# Patient Record
Sex: Female | Born: 1975 | Race: White | Hispanic: No | Marital: Single | Smoking: Current every day smoker
Health system: Southern US, Community
[De-identification: ages and names within clinical notes are randomized; demographics above are authoritative.]

## PROBLEM LIST (undated history)

## (undated) DIAGNOSIS — M5430 Sciatica, unspecified side: Secondary | ICD-10-CM

## (undated) DIAGNOSIS — G8929 Other chronic pain: Secondary | ICD-10-CM

## (undated) DIAGNOSIS — R519 Headache, unspecified: Secondary | ICD-10-CM

## (undated) DIAGNOSIS — M719 Bursopathy, unspecified: Secondary | ICD-10-CM

## (undated) DIAGNOSIS — R51 Headache: Secondary | ICD-10-CM

## (undated) HISTORY — PX: CHOLECYSTECTOMY: SHX55

## (undated) HISTORY — PX: OTHER SURGICAL HISTORY: SHX169

## (undated) HISTORY — DX: Headache: R51

## (undated) HISTORY — DX: Bursopathy, unspecified: M71.9

## (undated) HISTORY — PX: BACK SURGERY: SHX140

## (undated) HISTORY — DX: Other chronic pain: G89.29

## (undated) HISTORY — DX: Headache, unspecified: R51.9

---

## 1998-07-29 ENCOUNTER — Inpatient Hospital Stay (HOSPITAL_COMMUNITY): Admission: AD | Admit: 1998-07-29 | Discharge: 1998-07-29 | Payer: Self-pay | Admitting: Obstetrics & Gynecology

## 1998-10-02 ENCOUNTER — Emergency Department (HOSPITAL_COMMUNITY): Admission: EM | Admit: 1998-10-02 | Discharge: 1998-10-02 | Payer: Self-pay | Admitting: Emergency Medicine

## 1999-01-26 ENCOUNTER — Emergency Department (HOSPITAL_COMMUNITY): Admission: EM | Admit: 1999-01-26 | Discharge: 1999-01-26 | Payer: Self-pay | Admitting: Emergency Medicine

## 2000-04-04 ENCOUNTER — Ambulatory Visit (HOSPITAL_COMMUNITY): Admission: RE | Admit: 2000-04-04 | Discharge: 2000-04-04 | Payer: Self-pay | Admitting: Obstetrics and Gynecology

## 2000-04-04 ENCOUNTER — Encounter: Payer: Self-pay | Admitting: Obstetrics and Gynecology

## 2000-06-29 ENCOUNTER — Inpatient Hospital Stay (HOSPITAL_COMMUNITY): Admission: AD | Admit: 2000-06-29 | Discharge: 2000-06-29 | Payer: Self-pay | Admitting: Obstetrics and Gynecology

## 2000-07-13 ENCOUNTER — Inpatient Hospital Stay (HOSPITAL_COMMUNITY): Admission: AD | Admit: 2000-07-13 | Discharge: 2000-07-13 | Payer: Self-pay | Admitting: Obstetrics and Gynecology

## 2000-08-04 ENCOUNTER — Inpatient Hospital Stay (HOSPITAL_COMMUNITY): Admission: AD | Admit: 2000-08-04 | Discharge: 2000-08-07 | Payer: Self-pay | Admitting: Obstetrics and Gynecology

## 2000-08-05 ENCOUNTER — Encounter: Payer: Self-pay | Admitting: Obstetrics and Gynecology

## 2000-09-11 ENCOUNTER — Other Ambulatory Visit: Admission: RE | Admit: 2000-09-11 | Discharge: 2000-09-11 | Payer: Self-pay | Admitting: Obstetrics and Gynecology

## 2000-12-12 ENCOUNTER — Other Ambulatory Visit: Admission: RE | Admit: 2000-12-12 | Discharge: 2000-12-12 | Payer: Self-pay | Admitting: Obstetrics and Gynecology

## 2001-04-03 ENCOUNTER — Emergency Department (HOSPITAL_COMMUNITY): Admission: EM | Admit: 2001-04-03 | Discharge: 2001-04-03 | Payer: Self-pay | Admitting: Emergency Medicine

## 2001-04-09 ENCOUNTER — Other Ambulatory Visit: Admission: RE | Admit: 2001-04-09 | Discharge: 2001-04-09 | Payer: Self-pay | Admitting: Obstetrics and Gynecology

## 2001-07-10 ENCOUNTER — Other Ambulatory Visit: Admission: RE | Admit: 2001-07-10 | Discharge: 2001-07-10 | Payer: Self-pay | Admitting: Obstetrics and Gynecology

## 2001-09-05 ENCOUNTER — Emergency Department (HOSPITAL_COMMUNITY): Admission: EM | Admit: 2001-09-05 | Discharge: 2001-09-06 | Payer: Self-pay | Admitting: Emergency Medicine

## 2001-09-23 ENCOUNTER — Other Ambulatory Visit: Admission: RE | Admit: 2001-09-23 | Discharge: 2001-09-23 | Payer: Self-pay | Admitting: Obstetrics and Gynecology

## 2001-12-31 ENCOUNTER — Other Ambulatory Visit: Admission: RE | Admit: 2001-12-31 | Discharge: 2001-12-31 | Payer: Self-pay | Admitting: Obstetrics and Gynecology

## 2002-02-04 ENCOUNTER — Observation Stay (HOSPITAL_COMMUNITY): Admission: EM | Admit: 2002-02-04 | Discharge: 2002-02-04 | Payer: Self-pay | Admitting: Emergency Medicine

## 2002-03-13 ENCOUNTER — Emergency Department (HOSPITAL_COMMUNITY): Admission: EM | Admit: 2002-03-13 | Discharge: 2002-03-13 | Payer: Self-pay | Admitting: Emergency Medicine

## 2002-07-29 ENCOUNTER — Other Ambulatory Visit: Admission: RE | Admit: 2002-07-29 | Discharge: 2002-07-29 | Payer: Self-pay | Admitting: Obstetrics and Gynecology

## 2002-09-20 ENCOUNTER — Emergency Department (HOSPITAL_COMMUNITY): Admission: EM | Admit: 2002-09-20 | Discharge: 2002-09-21 | Payer: Self-pay | Admitting: Emergency Medicine

## 2003-06-23 ENCOUNTER — Inpatient Hospital Stay (HOSPITAL_COMMUNITY): Admission: AD | Admit: 2003-06-23 | Discharge: 2003-06-26 | Payer: Self-pay | Admitting: Obstetrics and Gynecology

## 2003-06-23 ENCOUNTER — Encounter (INDEPENDENT_AMBULATORY_CARE_PROVIDER_SITE_OTHER): Payer: Self-pay | Admitting: Specialist

## 2003-08-02 ENCOUNTER — Emergency Department (HOSPITAL_COMMUNITY): Admission: EM | Admit: 2003-08-02 | Discharge: 2003-08-02 | Payer: Self-pay | Admitting: Emergency Medicine

## 2003-08-11 ENCOUNTER — Other Ambulatory Visit: Admission: RE | Admit: 2003-08-11 | Discharge: 2003-08-11 | Payer: Self-pay | Admitting: Obstetrics and Gynecology

## 2003-09-17 ENCOUNTER — Emergency Department (HOSPITAL_COMMUNITY): Admission: EM | Admit: 2003-09-17 | Discharge: 2003-09-17 | Payer: Self-pay | Admitting: Emergency Medicine

## 2004-07-27 ENCOUNTER — Encounter: Admission: RE | Admit: 2004-07-27 | Discharge: 2004-07-27 | Payer: Self-pay | Admitting: Orthopedic Surgery

## 2004-08-10 ENCOUNTER — Encounter: Admission: RE | Admit: 2004-08-10 | Discharge: 2004-08-10 | Payer: Self-pay | Admitting: Orthopedic Surgery

## 2004-09-07 ENCOUNTER — Other Ambulatory Visit: Admission: RE | Admit: 2004-09-07 | Discharge: 2004-09-07 | Payer: Self-pay | Admitting: Obstetrics and Gynecology

## 2005-04-15 ENCOUNTER — Emergency Department (HOSPITAL_COMMUNITY): Admission: EM | Admit: 2005-04-15 | Discharge: 2005-04-15 | Payer: Self-pay | Admitting: Emergency Medicine

## 2006-02-11 ENCOUNTER — Emergency Department (HOSPITAL_COMMUNITY): Admission: EM | Admit: 2006-02-11 | Discharge: 2006-02-11 | Payer: Self-pay | Admitting: Emergency Medicine

## 2006-08-22 ENCOUNTER — Emergency Department (HOSPITAL_COMMUNITY): Admission: EM | Admit: 2006-08-22 | Discharge: 2006-08-22 | Payer: Self-pay | Admitting: Emergency Medicine

## 2006-08-22 ENCOUNTER — Inpatient Hospital Stay (HOSPITAL_COMMUNITY): Admission: EM | Admit: 2006-08-22 | Discharge: 2006-08-24 | Payer: Self-pay | Admitting: Emergency Medicine

## 2006-08-23 ENCOUNTER — Encounter (INDEPENDENT_AMBULATORY_CARE_PROVIDER_SITE_OTHER): Payer: Self-pay | Admitting: Specialist

## 2007-01-26 ENCOUNTER — Emergency Department (HOSPITAL_COMMUNITY): Admission: EM | Admit: 2007-01-26 | Discharge: 2007-01-26 | Payer: Self-pay | Admitting: Emergency Medicine

## 2007-02-18 ENCOUNTER — Emergency Department (HOSPITAL_COMMUNITY): Admission: EM | Admit: 2007-02-18 | Discharge: 2007-02-18 | Payer: Self-pay | Admitting: Emergency Medicine

## 2007-05-16 ENCOUNTER — Emergency Department (HOSPITAL_COMMUNITY): Admission: EM | Admit: 2007-05-16 | Discharge: 2007-05-16 | Payer: Self-pay | Admitting: Emergency Medicine

## 2007-08-26 ENCOUNTER — Ambulatory Visit (HOSPITAL_COMMUNITY): Admission: RE | Admit: 2007-08-26 | Discharge: 2007-08-26 | Payer: Self-pay | Admitting: Obstetrics and Gynecology

## 2007-10-17 ENCOUNTER — Encounter: Admission: RE | Admit: 2007-10-17 | Discharge: 2007-10-17 | Payer: Self-pay | Admitting: Orthopedic Surgery

## 2008-05-09 ENCOUNTER — Emergency Department (HOSPITAL_COMMUNITY): Admission: EM | Admit: 2008-05-09 | Discharge: 2008-05-09 | Payer: Self-pay | Admitting: Emergency Medicine

## 2008-07-01 ENCOUNTER — Emergency Department (HOSPITAL_COMMUNITY): Admission: EM | Admit: 2008-07-01 | Discharge: 2008-07-01 | Payer: Self-pay | Admitting: Emergency Medicine

## 2008-08-15 ENCOUNTER — Emergency Department (HOSPITAL_COMMUNITY): Admission: EM | Admit: 2008-08-15 | Discharge: 2008-08-16 | Payer: Self-pay | Admitting: Emergency Medicine

## 2008-10-12 ENCOUNTER — Emergency Department (HOSPITAL_COMMUNITY): Admission: EM | Admit: 2008-10-12 | Discharge: 2008-10-13 | Payer: Self-pay | Admitting: *Deleted

## 2009-02-06 ENCOUNTER — Emergency Department (HOSPITAL_COMMUNITY): Admission: EM | Admit: 2009-02-06 | Discharge: 2009-02-06 | Payer: Self-pay | Admitting: Emergency Medicine

## 2009-05-29 ENCOUNTER — Emergency Department (HOSPITAL_COMMUNITY): Admission: EM | Admit: 2009-05-29 | Discharge: 2009-05-29 | Payer: Self-pay | Admitting: Emergency Medicine

## 2009-07-31 ENCOUNTER — Emergency Department (HOSPITAL_COMMUNITY): Admission: EM | Admit: 2009-07-31 | Discharge: 2009-07-31 | Payer: Self-pay | Admitting: Emergency Medicine

## 2009-08-28 ENCOUNTER — Encounter (INDEPENDENT_AMBULATORY_CARE_PROVIDER_SITE_OTHER): Payer: Self-pay | Admitting: *Deleted

## 2009-08-28 ENCOUNTER — Emergency Department (HOSPITAL_COMMUNITY): Admission: EM | Admit: 2009-08-28 | Discharge: 2009-08-28 | Payer: Self-pay | Admitting: Emergency Medicine

## 2009-09-30 ENCOUNTER — Emergency Department (HOSPITAL_COMMUNITY): Admission: EM | Admit: 2009-09-30 | Discharge: 2009-09-30 | Payer: Self-pay | Admitting: Emergency Medicine

## 2009-10-25 ENCOUNTER — Emergency Department (HOSPITAL_COMMUNITY): Admission: EM | Admit: 2009-10-25 | Discharge: 2009-10-25 | Payer: Self-pay | Admitting: Emergency Medicine

## 2009-10-27 ENCOUNTER — Encounter (INDEPENDENT_AMBULATORY_CARE_PROVIDER_SITE_OTHER): Payer: Self-pay | Admitting: *Deleted

## 2009-11-10 ENCOUNTER — Encounter: Admission: RE | Admit: 2009-11-10 | Discharge: 2009-11-10 | Payer: Self-pay | Admitting: Orthopaedic Surgery

## 2009-12-15 ENCOUNTER — Emergency Department (HOSPITAL_COMMUNITY): Admission: EM | Admit: 2009-12-15 | Discharge: 2009-12-15 | Payer: Self-pay | Admitting: Emergency Medicine

## 2009-12-20 ENCOUNTER — Emergency Department (HOSPITAL_COMMUNITY): Admission: EM | Admit: 2009-12-20 | Discharge: 2009-12-20 | Payer: Self-pay | Admitting: Emergency Medicine

## 2010-01-18 ENCOUNTER — Emergency Department (HOSPITAL_COMMUNITY): Admission: EM | Admit: 2010-01-18 | Discharge: 2010-01-18 | Payer: Self-pay | Admitting: Emergency Medicine

## 2010-01-28 ENCOUNTER — Ambulatory Visit (HOSPITAL_COMMUNITY): Admission: RE | Admit: 2010-01-28 | Discharge: 2010-01-30 | Payer: Self-pay | Admitting: Orthopaedic Surgery

## 2010-06-16 NOTE — Letter (Signed)
Summary: New Patient letter  [facility]  [address] [place], [place] [address]   Phone: [phone]  Fax: [phone]       10/27/2009 MRN: [id number]  Madeline Singh [address] [place], [place]  [address]  Dear [name],  Welcome to the Gastroenterology Division at [facility].    You are scheduled to see Dr.  Christella Hartigan on 12-07-09 at 2:30p.m. on the 3rd floor at [facility], [address]  We ask that you try to arrive at our office 15 minutes prior to your appointment time to allow for check-in.  We would like you to complete the enclosed self-administered evaluation form prior to your visit and bring it with you on the day of your appointment.  We will review it with you.  Also, please bring a complete list of all your medications or, if you prefer, bring the medication bottles and we will list them.  Please bring your insurance card so that we may make a copy of it.  If your insurance requires a referral to see a specialist, please bring your referral form from your primary care physician.  Co-payments are due at the time of your visit and may be paid by cash, check or credit card.     Your office visit will consist of a consult with your physician (includes a physical exam), any laboratory testing he/she may order, scheduling of any necessary diagnostic testing (e.g. x-ray, ultrasound, CT-scan), and scheduling of a procedure (e.g. Endoscopy, Colonoscopy) if required.  Please allow enough time on your schedule to allow for any/all of these possibilities.    If you cannot keep your appointment, please call [phone] to cancel or reschedule prior to your appointment date.  This allows [place] the opportunity to schedule an appointment for another patient in need of care.  If you do not cancel or reschedule by 5 p.m. the business day prior to your appointment date, you will be charged a $50.00 late cancellation/no-show fee.    Thank you for choosing [facility] for your medical needs.  We appreciate the opportunity to care for you.  Please visit [place] at our website  to learn more about our practice.                     Sincerely,                                                             The Gastroenterology Division

## 2010-07-28 LAB — URINALYSIS, ROUTINE W REFLEX MICROSCOPIC
Glucose, UA: NEGATIVE mg/dL
Nitrite: NEGATIVE
Specific Gravity, Urine: 1.025 (ref 1.005–1.030)
Specific Gravity, Urine: 1.021 (ref 1.005–1.030)
Urobilinogen, UA: 1 mg/dL (ref 0.0–1.0)
pH: 6.5 (ref 5.0–8.0)

## 2010-07-28 LAB — DIFFERENTIAL
Eosinophils Absolute: 0.1 10*3/uL (ref 0.0–0.7)
Lymphs Abs: 3.2 10*3/uL (ref 0.7–4.0)
Monocytes Relative: 6 % (ref 3–12)
Neutrophils Relative %: 64 % (ref 43–77)

## 2010-07-28 LAB — COMPREHENSIVE METABOLIC PANEL
ALT: 11 U/L (ref 0–35)
AST: 13 U/L (ref 0–37)
Albumin: 4 g/dL (ref 3.5–5.2)
Calcium: 9.5 mg/dL (ref 8.4–10.5)
GFR calc Af Amer: >60 mL/min (ref >60)
Glucose, Bld: 96 mg/dL (ref 70–99)
Sodium: 140 mEq/L (ref 135–145)
Total Protein: 6.9 g/dL (ref 6.0–8.3)

## 2010-07-28 LAB — CBC
MCH: 31.9 pg (ref 26.0–34.0)
MCV: 94.5 fL (ref 78.0–100.0)
Platelets: 295 10*3/uL (ref 150–400)
RBC: 4.17 MIL/uL (ref 3.87–5.11)
RDW: 13 % (ref 11.5–15.5)

## 2010-07-28 LAB — URINE MICROSCOPIC-ADD ON

## 2010-07-28 LAB — PROTIME-INR: INR: 0.9 (ref 0.00–1.49)

## 2010-08-01 LAB — POCT PREGNANCY, URINE: Preg Test, Ur: NEGATIVE

## 2010-08-01 LAB — URINE MICROSCOPIC-ADD ON

## 2010-08-01 LAB — URINALYSIS, ROUTINE W REFLEX MICROSCOPIC
Glucose, UA: NEGATIVE mg/dL
Leukocytes, UA: NEGATIVE
pH: 6 (ref 5.0–8.0)

## 2010-08-01 LAB — GC/CHLAMYDIA PROBE AMP, GENITAL: GC Probe Amp, Genital: NEGATIVE

## 2010-08-02 LAB — POCT I-STAT, CHEM 8
BUN: 14 mg/dL (ref 6–23)
Calcium, Ion: 1.16 mmol/L (ref 1.12–1.32)
Chloride: 106 mEq/L (ref 96–112)
Creatinine, Ser: 0.9 mg/dL (ref 0.4–1.2)
Glucose, Bld: 120 mg/dL — ABNORMAL HIGH (ref 70–99)
HCT: 44 % (ref 36.0–46.0)

## 2010-08-02 LAB — URINALYSIS, ROUTINE W REFLEX MICROSCOPIC
Bilirubin Urine: NEGATIVE
Ketones, ur: NEGATIVE mg/dL
Nitrite: NEGATIVE
pH: 5 (ref 5.0–8.0)

## 2010-08-02 LAB — URINE MICROSCOPIC-ADD ON

## 2010-08-02 LAB — GC/CHLAMYDIA PROBE AMP, GENITAL: GC Probe Amp, Genital: NEGATIVE

## 2010-08-02 LAB — POCT PREGNANCY, URINE: Preg Test, Ur: NEGATIVE

## 2010-08-02 LAB — WET PREP, GENITAL
Trich, Wet Prep: NONE SEEN
Yeast Wet Prep HPF POC: NONE SEEN

## 2010-08-06 IMAGING — CT CT ABD-PELV W/O CM
3 of 4 series · 16 of 46 positions shown, 20 images · non-contrast
Comparison: None.

CLINICAL DATA: Right side pain.  History of bladder infection.
Cholecystectomy.

CT ABDOMEN AND PELVIS WITHOUT CONTRAST
TECHNIQUE: Multidetector CT imaging of the abdomen and pelvis was
performed following the standard protocol without intravenous
contrast.

[Series 2: stone_wo 5.0 b40f st · axial · 0.65mm/px · z∈[-430,-50]mm · 12 of 88 slices shown, 16 images]
[im 8/88  soft-tissue]
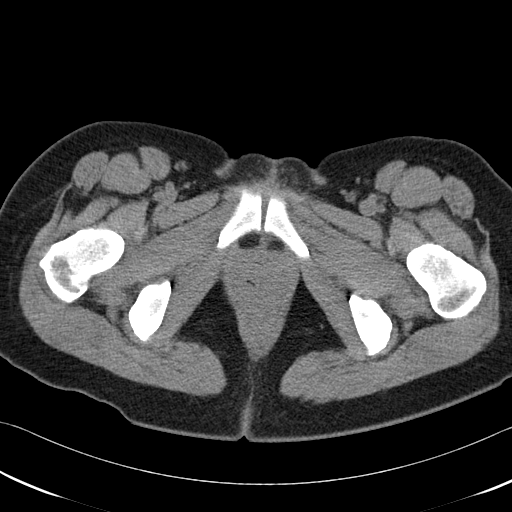
[im 8/88  bone]
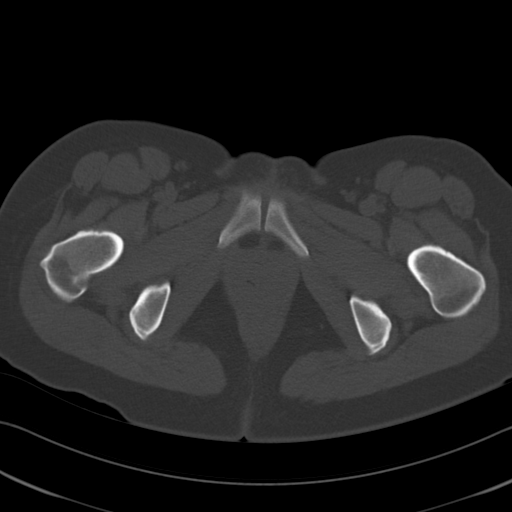
[im 15/88  soft-tissue]
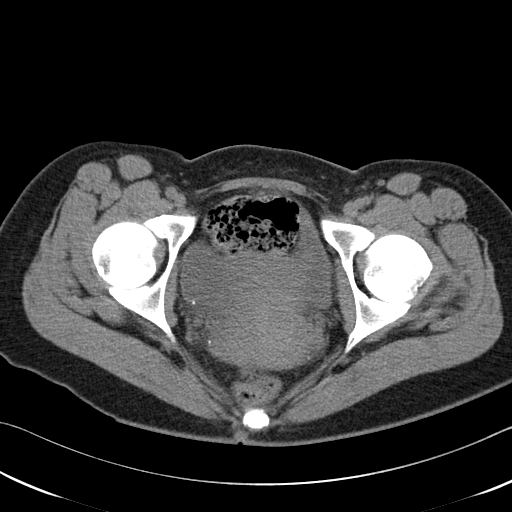
[im 22/88  soft-tissue]
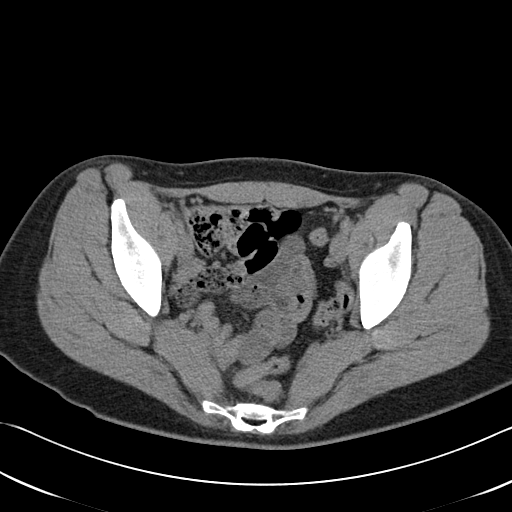
[im 33/88  soft-tissue]
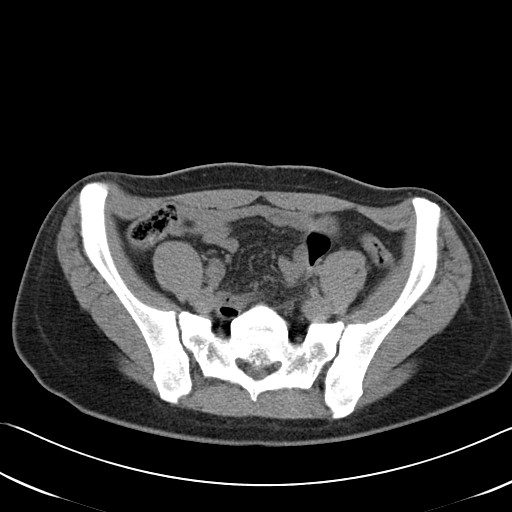
[im 40/88  soft-tissue]
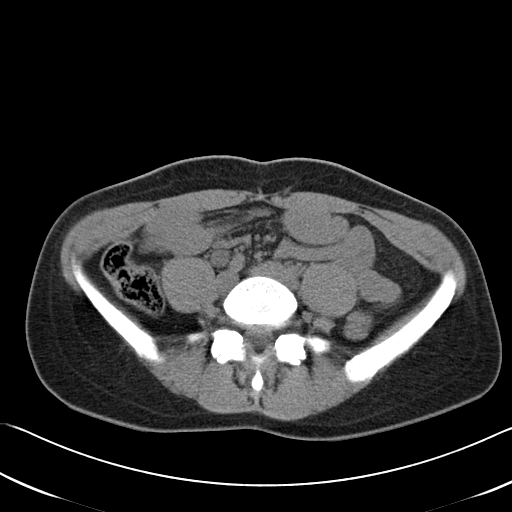
[im 48/88  soft-tissue]
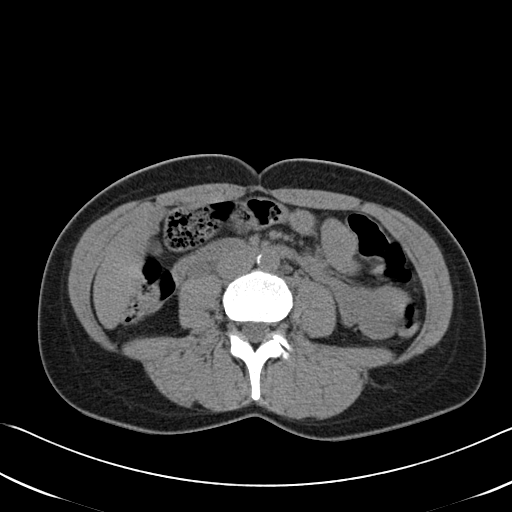
[im 55/88  soft-tissue]
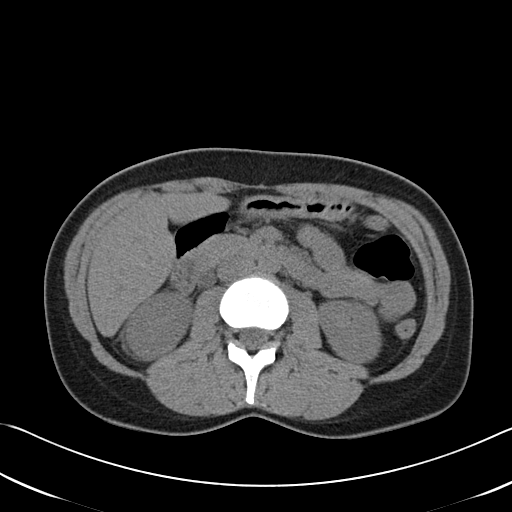
[im 66/88  soft-tissue]
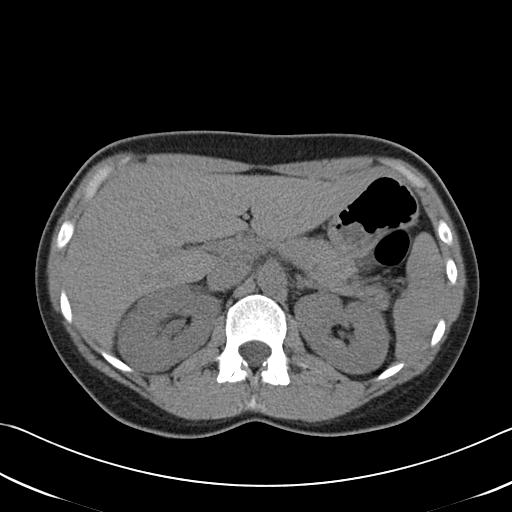
[im 73/88  soft-tissue]
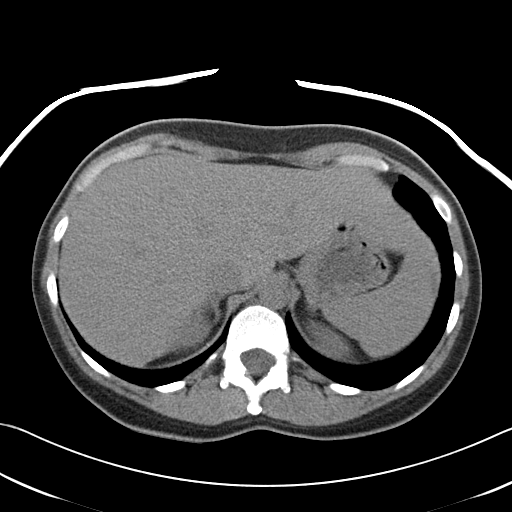
[im 73/88  lung]
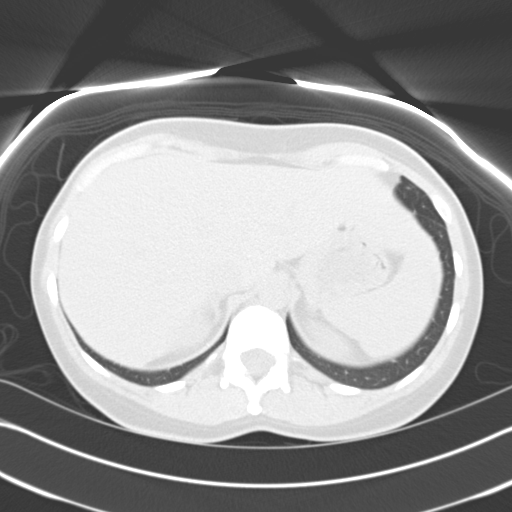
[im 73/88  bone]
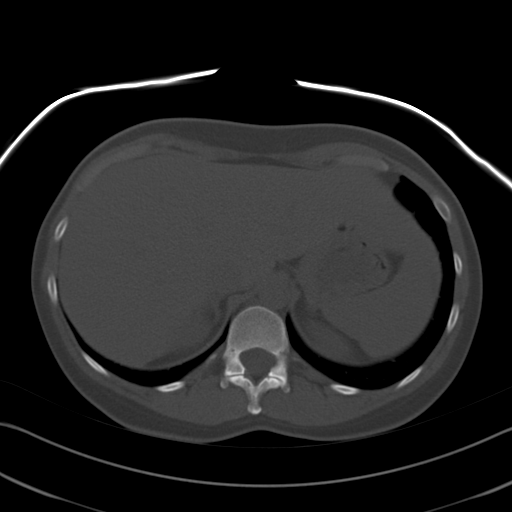
[im 77/88  lung]
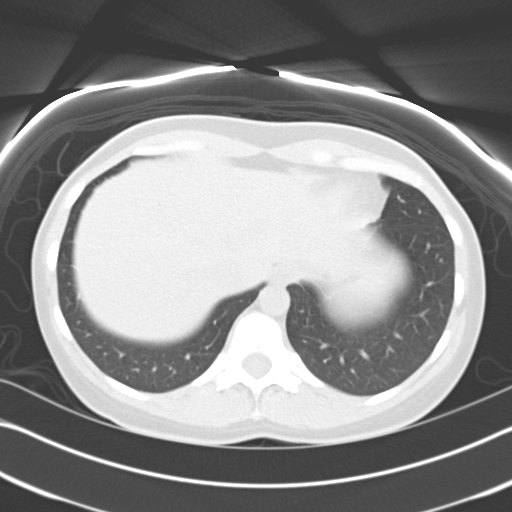
[im 80/88  soft-tissue]
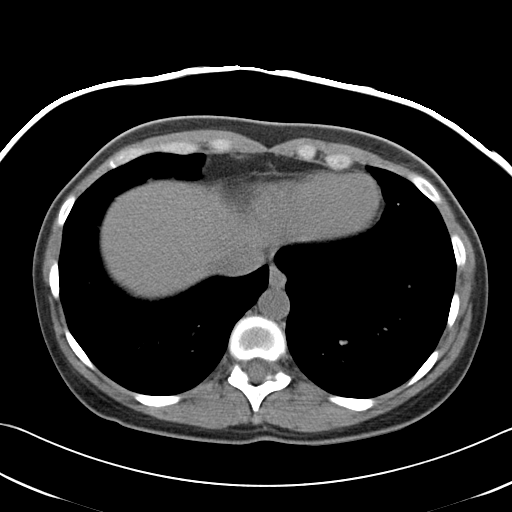
[im 80/88  lung]
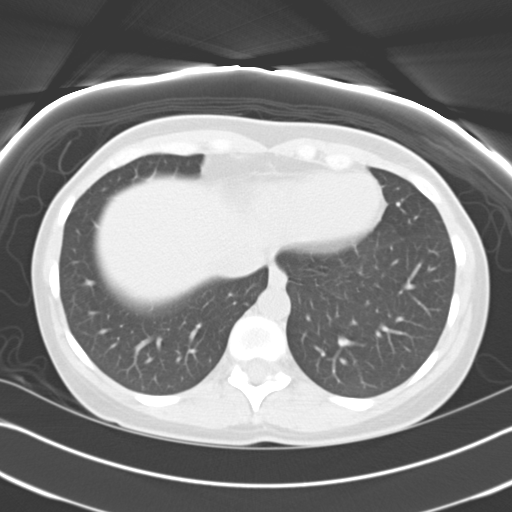
[im 84/88  lung]
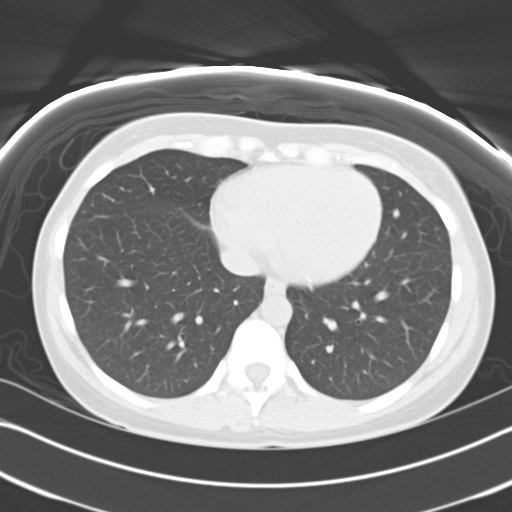

[Series 602: coronal abdomen · coronal · 0.89mm/px · 3 of 99 slices shown]
[im 33/99  soft-tissue]
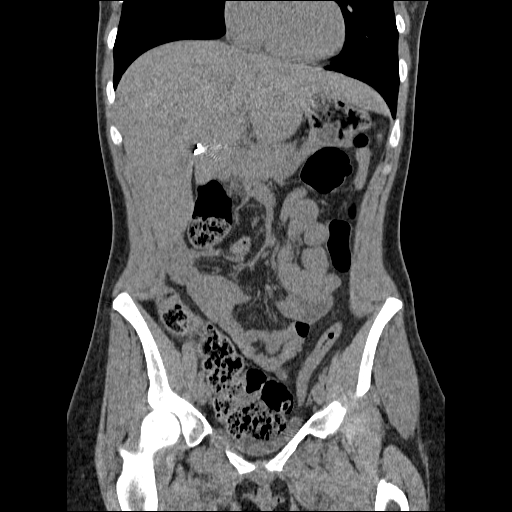
[im 44/99  soft-tissue]
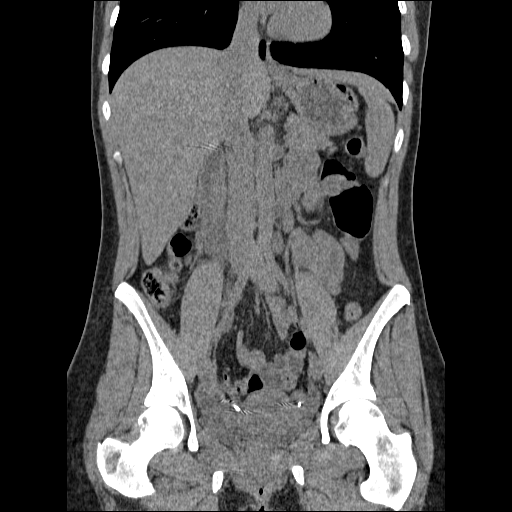
[im 55/99  soft-tissue]
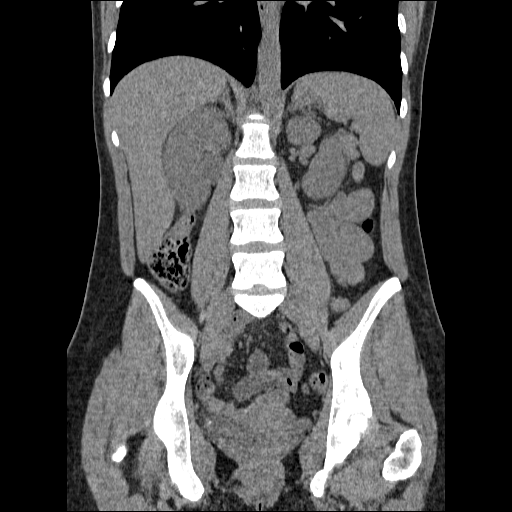

[Series 603: sagittal abdomen · sagittal · 0.89mm/px · 1 of 142 slices shown]
[im 48/142  soft-tissue]
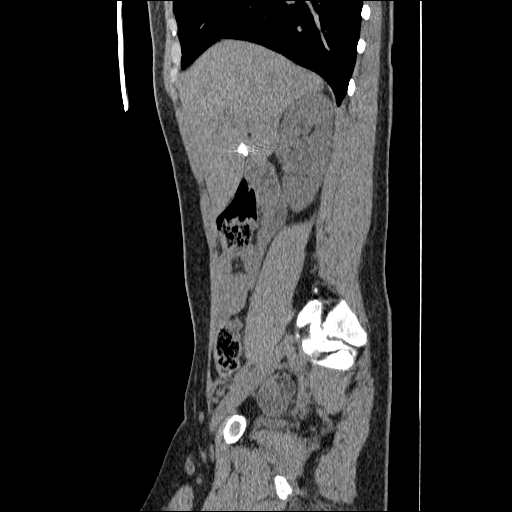

[16 of 46 positions shown; findings below may reference images not displayed]

FINDINGS: Moderate to right hydroureteronephrosis.  3 mm
calcification in the region of the right UVJ.

Cholecystectomy clips.  Negative liver, spleen, pancreas, and
adrenal glands.
IMPRESSION: Obstruction of the distal right ureter by a 3 mm calculus.

## 2010-08-07 LAB — POCT I-STAT, CHEM 8
BUN: 9 mg/dL (ref 6–23)
Calcium, Ion: 1.16 mmol/L (ref 1.12–1.32)
Chloride: 109 meq/L (ref 96–112)
Creatinine, Ser: 0.6 mg/dL (ref 0.4–1.2)
Glucose, Bld: 84 mg/dL (ref 70–99)
HCT: 39 % (ref 36.0–46.0)
Hemoglobin: 13.3 g/dL (ref 12.0–15.0)
Potassium: 3.6 meq/L (ref 3.5–5.1)
Sodium: 140 meq/L (ref 135–145)
TCO2: 24 mmol/L (ref 0–100)

## 2010-08-07 LAB — URINALYSIS, ROUTINE W REFLEX MICROSCOPIC
Bilirubin Urine: NEGATIVE
Glucose, UA: NEGATIVE mg/dL
Hgb urine dipstick: NEGATIVE
Ketones, ur: NEGATIVE mg/dL
Specific Gravity, Urine: 1.013 (ref 1.005–1.030)
pH: 6 (ref 5.0–8.0)

## 2010-08-07 LAB — DIFFERENTIAL
Basophils Absolute: 0 10*3/uL (ref 0.0–0.1)
Eosinophils Relative: 0 % (ref 0–5)
Lymphocytes Relative: 22 % (ref 12–46)
Lymphs Abs: 1.9 10*3/uL (ref 0.7–4.0)
Monocytes Absolute: 0.4 10*3/uL (ref 0.1–1.0)
Monocytes Relative: 5 % (ref 3–12)
Neutro Abs: 6.2 10*3/uL (ref 1.7–7.7)

## 2010-08-07 LAB — POCT PREGNANCY, URINE: Preg Test, Ur: NEGATIVE

## 2010-08-07 LAB — WET PREP, GENITAL
Clue Cells Wet Prep HPF POC: NONE SEEN
Trich, Wet Prep: NONE SEEN

## 2010-08-07 LAB — CBC
MCHC: 33.2 g/dL (ref 30.0–36.0)
Platelets: 242 10*3/uL (ref 150–400)
RDW: 12.2 % (ref 11.5–15.5)

## 2010-08-07 LAB — GC/CHLAMYDIA PROBE AMP, GENITAL
Chlamydia, DNA Probe: NEGATIVE
GC Probe Amp, Genital: NEGATIVE

## 2010-09-12 ENCOUNTER — Emergency Department (HOSPITAL_COMMUNITY)
Admission: EM | Admit: 2010-09-12 | Discharge: 2010-09-12 | Disposition: A | Discharge status: Home / Self Care | Payer: Medicaid Other | Attending: Emergency Medicine | Admitting: Emergency Medicine

## 2010-09-12 DIAGNOSIS — IMO0002 Reserved for concepts with insufficient information to code with codable children: Secondary | ICD-10-CM | POA: Insufficient documentation

## 2010-09-12 DIAGNOSIS — M545 Low back pain, unspecified: Secondary | ICD-10-CM | POA: Insufficient documentation

## 2010-09-30 NOTE — Discharge Summary (Signed)
[facility]  Patient:    Madeline Singh, Madeline Singh                       MRN: [id number] Adm. Date:  [id number] Disc. Date: 08/07/00 Attending:  Michaele Offer                           Discharge Summary  ADMISSION DIAGNOSES:          Intrauterine pregnancy at 35 weeks with preterm labor.  DISCHARGE DIAGNOSES:          Intrauterine pregnancy at 35 weeks with preterm labor.  PROCEDURE:                    Spontaneous vaginal delivery, biophysical profile.  COMPLICATIONS:                None.  CONSULTATIONS:                None.  HISTORY AND PHYSICAL:         This is a 35 year old white female gravida 3, para 1-0-1-1 with an EGA of 35+ weeks by an 8 week ultrasound with an [facility] of April 24 who presented on March 23 with the complaint of regular contractions without bleeding or ruptured membranes with good fetal movement.  Vaginal examination in the office on March 22 was 2 cm dilated, 60% effaced.  Vaginal examination in maternity admissions revealed her to be 4, 80, -1/-2 and the patient was admitted.  Prenatal care essentially uncomplicated.  PRENATAL LABORATORIES:        Blood type B+ with a negative antibody screen. RPR nonreactive.  Rubella immune.  Hepatitis B surface antigen negative.  HIV negative.  Gonorrhea and chlamydia negative.  Triple screen normal.  One hour glucola 113.  PAST OBSTETRICAL HISTORY:     In 1996 spontaneous abortion, in 1997 vaginal delivery at 37 weeks 6 pounds 8 ounces without complications.  The remainder of her history is significant only for the fact that she smokes half a pack of cigarettes a day.  PHYSICAL EXAMINATION  VITAL SIGNS:                  She is afebrile with stable vital signs.  Fetal heart tracing initially was reassuring, but not reactive without decelerations and she initially had regular contractions which then spaced out.  ABDOMEN:                      Gravid, nontender with an estimated fetal  weight of less than 6 pounds.  PELVIC:                       Vaginal examination again on admission was 4, 80, -1/-2.  HOSPITAL COURSE:              Patient was admitted and started on penicillin prophylaxis for prematurity.  Her contractions spaced out and her cervix did not change.  However, the nonstress test was not reactive, although it was reassuring.  Due to this she had a biophysical profile which revealed 6/8 with two points off for no respirations so the patient was observed overnight.  On March 24 her NST became reactive and the patient was allowed to walk.  When she came back from walking she was complaining of increased strength of her contractions, although they were still not very  uncomfortable.  Vaginal examination on the morning of March 24 revealed her to be 6-7, 90% effaced with a 0 station and a vertex presentation and adequate pelvis.  Artificial rupture of membranes was performed which revealed clear fluid.  She then progressed to complete and pushed well.  On the morning of March 24 she had an SVD of a viable female infant with Apgars of 9 and 9 that weighed 5 pounds 12 ounces over an intact perineum.  Placenta delivered spontaneously and was intact.  She had left labial perineal abrasions which were hemostatic and not repaired.  Estimated blood loss was less than 500 cc.  There was a three vessel cord.  Placenta was sent to pathology due to prematurity.  Postpartum she did very well.  Breast-fed and breast pumped for the baby without complications and remained afebrile.  On postpartum day #1 the baby did have a cyanotic episode and was transferred to the NICU and was getting a septic workup, but was stable.  On the morning of postpartum day #2 the patient was stable for discharge home.  LABORATORIES:                 Predelivery hemoglobin 11.2, postdelivery 9.7.  CONDITION ON DISCHARGE:       Stable.  DISPOSITION:                  Discharged to home.  DIET:                          Regular.  ACTIVITY:                     Pelvic rest.  FOLLOW-UP:                    Four to six weeks.  MEDICATIONS:                  Darvocet and ibuprofen p.r.n. pain.  DISCHARGE INSTRUCTIONS:       She is given our discharge pamphlet. DD:  08/07/00 TD:  08/07/00 Job: [id number]

## 2010-09-30 NOTE — Op Note (Signed)
Madeline Singh                ACCOUNT NO.:  [id number]   MEDICAL RECORD NO.:  [id number]          PATIENT TYPE:  INP   LOCATION:  1609                         FACILITY:  [facility]   PHYSICIAN:  Anselm Pancoast. Weatherly, M.D.DATE OF BIRTH:  04-09-1976   DATE OF PROCEDURE:  08/23/2006  DATE OF DISCHARGE:                               OPERATIVE REPORT   PREOPERATIVE DIAGNOSIS:  Acute cholecystitis with stones.   POSTOPERATIVE DIAGNOSIS:  Acute cholecystitis with stones.   OPERATION:  Laparoscopic cholecystectomy with cholangiogram.   SURGEON:  Anselm Pancoast. Zachery Dakins, MD.   ASSISTANT:  Leonie Man, MD.   HISTORY:  Madeline Singh is a 35 year old female, who presented to the  emergency room really night before last and was given pain medication.  She had had a previous ultrasound over six months ago when she was  admitted to the ER that showed stones, but she said that he had not  suggested that she see a Careers adviser.  This time, the pain was fairly  significant.  Laboratory studies were unremarkable, and she was given  Vicodin and released, returned approximately 12 hours later, and then I  was asked to see her last night at approximately 11 p.m.  She was  definitely tender, was not febrile, and I reviewed the old ultrasound  and it showed that she had numerous stones within her gallbladder, and I  thought that this was definitely acute cholecystitis.  I started her on  Unasyn.  No beds were available, and she spent the night in the  emergency room.  This morning, she was feeling but was having a low-  grade fever, and she had been added to the OR schedule after the morning  office.  Preoperatively, she was given another dose of Unasyn.  She does  have a temperature of 101.   Induction of general anesthesia, endotracheal tube, an oral tube into  the stomach, and PAF stockings, the abdomen was prepped with Betadine  solution and draped in a sterile manner.  A small incision was made  below the umbilicus and, upon entering the peritoneal cavity, a  pursestring suture of #0 Vicryl was placed and Hassan cannula  introduced.  The gallbladder was acutely inflamed and very tense.  The  upper 10-mm trocar was placed through direct vision, and two 5-mm  trocars were placed by Dr. Lurene Shadow.  We had anesthetized the fascia with  Marcaine on each of these locations.  At first trying to grab the  gallbladder, it was so tense that it was not possible, and we used the  __________ aspirator to decompress this so we could grasp it with the  million dollar graspers.  There was marked inflammation and a thickening  where the peritoneum and omentum were very adherent to the gallbladder,  and we kind of very carefully teased it out as it appeared that the  gallbladder was kind of up under the common bile duct area.  Very  cautiously, we freed up the proximal portion of the gallbladder, kind of  peeling the inflammatory peel, never could actually see definitely the  duodenum,  but I am sure the duodenum was in close proximity.  We then  could see the stone that was impacted in the neck of the gallbladder and  where the cystic duct was not that significantly dilated, and I could  encompass it with a right angle.  The clip was placed off the junction  of the cystic duct on the gallbladder after pushing the stone back up  into the gallbladder, and then we made a small opening, a Cook catheter  was introduced, held in place with a clip, and a cholangiogram was  obtained, and it showed the cystic duct with normal common hepatic and  common bile duct.  I removed the catheter, triply clipped the cystic  duct proximally and divided it, could then see a branch of what I think  is a short cystic artery off the common hepatic artery.  This was doubly  clipped proximally, singly distally, and divided, and then kind of  carefully peeling the gallbladder dissection from what most likely is a  mesentery of  the gallbladder.  Numerous little vessels were clipped for  good hemostasis, and then the gallbladder after it was completely freed  was placed in an EndoCatch bag.  I did get into the posterior wall of  the gallbladder right where it was really kind of intrahepatic in the  most distal portion of the liver, but no stones were dropped down and we  were able to free it up and then place it in the EndoCatch bag.  The  hemostasis inspected, irrigated, and aspirated, switched the camera to  the upper 10-mm port, withdrew the bag containing the gallbladder, and  then reinserted the [facility] cannulae.  The camera was switched back into  the proximal port and irrigated, aspirated, looked, no evidence of any  bleeding and no Gelfoam was placed, and we irrigated and removed all the  fluid and then withdrew the 5-mm ports.  I then removed the [facility]  cannulae, put a figure-of-eight of #0 Vicryl in addition to the  pursestring that had already been placed, tied them both, then  anesthetized the fascia in the umbilicus.  The upper 10-mm port  withdrawn, I put a figure-of-eight in the fascia with #0 Vicryl, and  then subcuticular sutures of #4-0 Vicryl, Benzoin, and Steri-Strips on  the skin.  Patient tolerated the procedure nicely.  I am going to  continue on the Unasyn since she had a fever, and hopefully she will  feel much better tomorrow and be able to be released.  I am going to  check a CBC and a CMET in the morning.           ______________________________  Anselm Pancoast. Zachery Dakins, M.D.     WJW/MEDQ  D:  08/23/2006  T:  08/23/2006  Job:  [id number]

## 2010-09-30 NOTE — Discharge Summary (Signed)
NAME:  Madeline, Singh                          ACCOUNT NO.:  [id number]   MEDICAL RECORD NO.:  [id number]                   PATIENT TYPE:  INP   LOCATION:  9132                                 FACILITY:  [facility]   PHYSICIAN:  Zenaida Niece, M.D.             DATE OF BIRTH:  [birth year]   DATE OF ADMISSION:  06/23/2003  DATE OF DISCHARGE:                                 DISCHARGE SUMMARY   ADMISSION DIAGNOSES:  1. Intrauterine pregnancy at 34 weeks.  2. Preterm premature rupture of membranes.  3. Preterm labor.   DISCHARGE DIAGNOSES:  1. Intrauterine pregnancy at 34 weeks.  2. Preterm premature rupture of membranes.  3. Preterm labor.   PROCEDURE:  On June 24, 2003 she had a spontaneous vaginal delivery.   HISTORY AND PHYSICAL:  This is a 35 year old white female gravida 4 para 1-1-  1-2 with an EGA of [redacted] weeks by a 9-week ultrasound with a due date of March  23 who presents with a complaint of leaking fluid since 1730 on February 8  without contractions or bleeding with good fetal movement.  Evaluation in  triage eventually was able to confirm ruptured membranes.  Prenatal care  complicated by headaches treated with Vicodin and upper respiratory  infections treated with antibiotics.  Prenatal labs:  Blood type is B  positive with negative antibody screen, RPR nonreactive, rubella immune,  hepatitis B surface antigen negative, HIV negative, gonorrhea and chlamydia  negative, triple screen normal, one-hour Glucola 135.  Obstetric history:  In 1996, spontaneous abortion.  In 1997, vaginal delivery at 37 weeks, 6  pounds 8 ounces.  In 2002, vaginal delivery at 35 weeks, 5 pounds 12 ounces,  complicated by preterm labor.  GYN history:  History of cryotherapy in 2002  with normal follow-up.  Physical exam:  She is afebrile with stable vital  signs.  Fetal heart tracing reassuring with rare contractions.  Abdomen  gravid, nontender, with an estimated fetal weight of 6 pounds.  Vaginal  exam  is 1, 70, -2, with vertex presentation.   HOSPITAL COURSE:  The patient was admitted and had a few contractions on her  own.  I discussed risks and benefits with the patient and we elected to  proceed with induction.  She did walk but was not able to enter labor with  that and thus was started on Pitocin.  Once she entered active labor she  progressed quickly to complete, pushed well, and on the morning of February  9 had a vaginal delivery of a viable female infant with Apgars of 9 and 9 that  weighed 4 pounds 12 ounces.  Placenta delivered spontaneous, was intact.  Perineum was intact and estimated blood loss was less than 500 mL.  Postpartum she had no complications.  Predelivery hemoglobin 11.3,  postdelivery 10.1.  On postpartum day #2 she was stable for discharge home.   DISCHARGE INSTRUCTIONS:  1.  Regular diet.  2. Pelvic rest.  3. Follow up in 6 weeks.  4. Medications are Percocet #20 one p.o. q.6h. p.r.n. as well as over-the-     counter ibuprofen.  5. She is given our discharge pamphlet.                                               Zenaida Niece, M.D.    TDM/MEDQ  D:  06/26/2003  T:  06/26/2003  Job:  [id number]

## 2010-09-30 NOTE — H&P (Signed)
NAMEALMER, LITTLETON                ACCOUNT NO.:  [id number]   MEDICAL RECORD NO.:  [id number]          PATIENT TYPE:  INP   LOCATION:  1609                         FACILITY:  [facility]   PHYSICIAN:  Anselm Pancoast. Weatherly, M.D.DATE OF BIRTH:  29-May-1975   DATE OF ADMISSION:  08/22/2006  DATE OF DISCHARGE:                              HISTORY & PHYSICAL   CHIEF COMPLAINT:  Severe upper abdominal pain and known gallstones.   HISTORY OF PRESENT ILLNESS:  Madeline Singh is a 35 year old female who  came to the emergency room the night before last in the early a.m. and  was seen by the ER physician.  She had eaten and checked laboratory  studies, which were kind of unremarkable, and then released her after  giving her Vicodin.  She had severe pain that reoccurred shortly  afterwards and returned to the ER last evening.  Dr. Freida Busman saw her  probably about 10 p.m. and asked me to see her.  He had requested that  they repeat another ultrasound, but on examination, she as definitely  tender in the right upper quadrant and was not febrile.  I reviewed the  old ultrasound I had done in September when she had presented to the  emergency room and felt that she had definitely acute cholecystitis.  It  was about midnight at this time and I added her to the OR schedule to be  done today.  The patient states that she has had three pregnancies.  I  think her children are 10, 7 and about 3.  She is single at this time  and the children are with relatives.  She is not on any type of chronic  medication and, of course, had been on the Vicodin yesterday.  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   REVIEW OF SYSTEMS:  Essentially negative.  She said that when she had  been seen in the emergency room in September, they had not recommended  surgery, but had suggested some type of medication that she was not able  to afford.  She has an IDU in  place.   PHYSICAL EXAMINATION:  GENERAL:  She is a young female who appears her  stated age.  She was nearly crying with abdominal pain when I first saw  her about 11 p.m.  An IV was started and we gave her some morphine and  started her on Unasyn.   LABORATORY DATA AND X-RAY FINDINGS:  Her laboratory studies that had  been done earlier that day show electrolytes normal as were the liver  function studies.  Her white blood count was about 10,600 and an amylase  was 25 while lipase was 25.  Her urinalysis was essentially unremarkable  and a pregnancy test was negative.   IMPRESSION:  Acute cholecystitis with known gallstones.   PLAN:  Urgent laparoscopic cholecystectomy with cholangiogram.           ______________________________  Anselm Pancoast. Zachery Dakins, M.D.     WJW/MEDQ  D:  08/23/2006  T:  08/23/2006  Job:  [id number]

## 2010-10-30 ENCOUNTER — Emergency Department (HOSPITAL_COMMUNITY)
Admission: EM | Admit: 2010-10-30 | Discharge: 2010-10-30 | Disposition: A | Discharge status: Home / Self Care | Payer: Medicaid Other | Attending: Emergency Medicine | Admitting: Emergency Medicine

## 2010-10-30 DIAGNOSIS — M549 Dorsalgia, unspecified: Secondary | ICD-10-CM | POA: Insufficient documentation

## 2010-10-30 DIAGNOSIS — R209 Unspecified disturbances of skin sensation: Secondary | ICD-10-CM | POA: Insufficient documentation

## 2010-11-01 ENCOUNTER — Other Ambulatory Visit: Payer: Self-pay | Admitting: Orthopaedic Surgery

## 2010-11-01 DIAGNOSIS — M545 Low back pain: Secondary | ICD-10-CM

## 2010-11-05 ENCOUNTER — Emergency Department (HOSPITAL_COMMUNITY)
Admission: EM | Admit: 2010-11-05 | Discharge: 2010-11-05 | Disposition: A | Discharge status: Home / Self Care | Payer: Medicaid Other | Attending: Emergency Medicine | Admitting: Emergency Medicine

## 2010-11-05 DIAGNOSIS — M25519 Pain in unspecified shoulder: Secondary | ICD-10-CM | POA: Insufficient documentation

## 2010-11-05 DIAGNOSIS — Z9889 Other specified postprocedural states: Secondary | ICD-10-CM | POA: Insufficient documentation

## 2010-11-13 ENCOUNTER — Other Ambulatory Visit: Payer: Medicaid Other

## 2010-11-21 ENCOUNTER — Ambulatory Visit
Admission: RE | Admit: 2010-11-21 | Discharge: 2010-11-21 | Disposition: A | Discharge status: Home / Self Care | Payer: Medicaid Other | Source: Ambulatory Visit | Attending: Orthopaedic Surgery | Admitting: Orthopaedic Surgery

## 2010-11-21 DIAGNOSIS — M545 Low back pain: Secondary | ICD-10-CM

## 2010-11-21 MED ORDER — GADOBENATE DIMEGLUMINE 529 MG/ML IV SOLN: 12.0000 mL | Freq: Once | INTRAVENOUS | Status: AC | PRN

## 2010-12-21 ENCOUNTER — Emergency Department (HOSPITAL_COMMUNITY)
Admission: EM | Admit: 2010-12-21 | Discharge: 2010-12-21 | Disposition: A | Discharge status: Home / Self Care | Payer: Medicaid Other | Attending: Emergency Medicine | Admitting: Emergency Medicine

## 2010-12-21 DIAGNOSIS — M79609 Pain in unspecified limb: Secondary | ICD-10-CM | POA: Insufficient documentation

## 2010-12-21 DIAGNOSIS — G8929 Other chronic pain: Secondary | ICD-10-CM | POA: Insufficient documentation

## 2010-12-21 DIAGNOSIS — IMO0002 Reserved for concepts with insufficient information to code with codable children: Secondary | ICD-10-CM | POA: Insufficient documentation

## 2011-01-13 ENCOUNTER — Emergency Department (HOSPITAL_COMMUNITY): Payer: Medicaid Other

## 2011-01-13 ENCOUNTER — Emergency Department (HOSPITAL_COMMUNITY)
Admission: EM | Admit: 2011-01-13 | Discharge: 2011-01-13 | Disposition: A | Discharge status: Home / Self Care | Payer: Medicaid Other | Attending: Emergency Medicine | Admitting: Emergency Medicine

## 2011-01-13 DIAGNOSIS — Z9089 Acquired absence of other organs: Secondary | ICD-10-CM | POA: Insufficient documentation

## 2011-01-13 DIAGNOSIS — R109 Unspecified abdominal pain: Secondary | ICD-10-CM | POA: Insufficient documentation

## 2011-01-13 DIAGNOSIS — M545 Low back pain, unspecified: Secondary | ICD-10-CM | POA: Insufficient documentation

## 2011-01-13 DIAGNOSIS — R51 Headache: Secondary | ICD-10-CM | POA: Insufficient documentation

## 2011-01-13 LAB — URINE MICROSCOPIC-ADD ON

## 2011-01-13 LAB — URINALYSIS, ROUTINE W REFLEX MICROSCOPIC
Bilirubin Urine: NEGATIVE
Glucose, UA: NEGATIVE mg/dL
Ketones, ur: NEGATIVE mg/dL
Nitrite: NEGATIVE
Specific Gravity, Urine: 1.02 (ref 1.005–1.030)
pH: 5.5 (ref 5.0–8.0)

## 2011-01-25 ENCOUNTER — Emergency Department (HOSPITAL_COMMUNITY)
Admission: EM | Admit: 2011-01-25 | Discharge: 2011-01-25 | Disposition: A | Discharge status: Home / Self Care | Payer: Medicaid Other | Attending: Emergency Medicine | Admitting: Emergency Medicine

## 2011-01-25 DIAGNOSIS — K089 Disorder of teeth and supporting structures, unspecified: Secondary | ICD-10-CM | POA: Insufficient documentation

## 2011-02-16 LAB — CBC
HCT: 39.5 % (ref 36.0–46.0)
Platelets: 240 10*3/uL (ref 150–400)
RDW: 12.2 % (ref 11.5–15.5)
WBC: 5.2 10*3/uL (ref 4.0–10.5)

## 2011-02-16 LAB — DIFFERENTIAL
Basophils Absolute: 0 10*3/uL (ref 0.0–0.1)
Eosinophils Absolute: 0.2 10*3/uL (ref 0.0–0.7)
Eosinophils Relative: 3 % (ref 0–5)
Lymphocytes Relative: 37 % (ref 12–46)
Lymphs Abs: 1.9 10*3/uL (ref 0.7–4.0)
Neutrophils Relative %: 54 % (ref 43–77)

## 2011-02-16 LAB — POCT PREGNANCY, URINE: Preg Test, Ur: NEGATIVE

## 2011-03-04 ENCOUNTER — Emergency Department (HOSPITAL_COMMUNITY)
Admission: EM | Admit: 2011-03-04 | Discharge: 2011-03-04 | Disposition: A | Discharge status: Home / Self Care | Payer: Medicaid Other | Attending: Emergency Medicine | Admitting: Emergency Medicine

## 2011-03-04 DIAGNOSIS — M545 Low back pain, unspecified: Secondary | ICD-10-CM | POA: Insufficient documentation

## 2011-03-04 DIAGNOSIS — M25519 Pain in unspecified shoulder: Secondary | ICD-10-CM | POA: Insufficient documentation

## 2011-03-04 DIAGNOSIS — G8929 Other chronic pain: Secondary | ICD-10-CM | POA: Insufficient documentation

## 2011-03-04 DIAGNOSIS — Z9889 Other specified postprocedural states: Secondary | ICD-10-CM | POA: Insufficient documentation

## 2011-05-24 ENCOUNTER — Encounter (HOSPITAL_COMMUNITY): Payer: Self-pay | Admitting: *Deleted

## 2011-05-24 ENCOUNTER — Emergency Department (HOSPITAL_COMMUNITY)
Admission: EM | Admit: 2011-05-24 | Discharge: 2011-05-24 | Disposition: A | Discharge status: Home / Self Care | Payer: Medicaid Other | Attending: Emergency Medicine | Admitting: Emergency Medicine

## 2011-05-24 DIAGNOSIS — F17200 Nicotine dependence, unspecified, uncomplicated: Secondary | ICD-10-CM | POA: Insufficient documentation

## 2011-05-24 DIAGNOSIS — M549 Dorsalgia, unspecified: Secondary | ICD-10-CM | POA: Insufficient documentation

## 2011-05-24 MED ORDER — METHYLPREDNISOLONE 4 MG PO KIT: 32.0000 mg | PACK | Freq: Two times a day (BID) | ORAL | Status: DC

## 2011-05-24 MED ORDER — KETOROLAC TROMETHAMINE 60 MG/2ML IM SOLN
60.0000 mg | Freq: Once | INTRAMUSCULAR | Status: AC
  Administered 2011-05-24: 60 mg via INTRAMUSCULAR
  Filled 2011-05-24: qty 2

## 2011-05-24 MED ORDER — METHOCARBAMOL 500 MG PO TABS: 500.0000 mg | ORAL_TABLET | Freq: Two times a day (BID) | ORAL | Status: AC

## 2011-05-24 MED ORDER — HYDROCODONE-ACETAMINOPHEN 5-325 MG PO TABS: 1.0000 | ORAL_TABLET | Freq: Four times a day (QID) | ORAL | Status: AC | PRN

## 2011-05-24 MED ORDER — DEXAMETHASONE SODIUM PHOSPHATE 10 MG/ML IJ SOLN
10.0000 mg | Freq: Once | INTRAMUSCULAR | Status: AC
  Administered 2011-05-24: 10 mg via INTRAMUSCULAR
  Filled 2011-05-24: qty 1

## 2011-05-24 MED ORDER — METHOCARBAMOL 500 MG PO TABS
500.0000 mg | ORAL_TABLET | Freq: Once | ORAL | Status: AC
  Administered 2011-05-24: 500 mg via ORAL
  Filled 2011-05-24: qty 1

## 2011-05-24 NOTE — ED Notes (Signed)
Pt states "was put on steroids last week by primary care but I quit taking them because they made me have a metal taste in my mouth, have an appt with a neurologist 06/21/2011."

## 2011-05-24 NOTE — ED Provider Notes (Signed)
History     CSN: [id number]  Arrival date & time 05/24/11  1355   First MD Initiated Contact with Patient 05/24/11 1722     5:56 PM HPI Pt reports a h/o Sciatica. States 2 days ago her Left sided sciatica pain began to hurt. Denies known injury. Reports sharp shooting pain down her left leg. Worse with certain positions. No perineal numbness or saddle anesthesias.  Patient is a 36 y.o. female presenting with back pain. The history is provided by the patient.  Back Pain  This is a chronic problem. The current episode started 2 days ago. The problem occurs constantly. The problem has been gradually worsening. The pain is associated with no known injury. The pain is present in the lumbar spine. The quality of the pain is described as stabbing and shooting. The pain radiates to the left thigh, left knee and left foot. The pain is severe. Associated symptoms include leg pain. Pertinent negatives include no chest pain, no fever, no numbness, no headaches, no abdominal pain, no bowel incontinence, no perianal numbness, no bladder incontinence, no dysuria, no pelvic pain, no paresthesias, no tingling and no weakness. She has tried NSAIDs for the symptoms. The treatment provided no relief.    History reviewed. No pertinent past medical history.  Past Surgical History  Procedure Date  . Back surgery   . Cholecystectomy   . Laproscopic & debriding of right shoulder     No family history on file.  History  Substance Use Topics  . Smoking status: Current Everyday Smoker -- 0.5 packs/day  . Smokeless tobacco: Not on file  . Alcohol Use: No    OB History    Grav Para Term Preterm Abortions TAB SAB Ect Mult Living                  Review of Systems  Constitutional: Negative for fever and chills.  HENT: Negative for neck pain and neck stiffness.   Cardiovascular: Negative for chest pain.  Gastrointestinal: Negative for abdominal pain and bowel incontinence.  Genitourinary: Negative for  bladder incontinence, dysuria, urgency, frequency, hematuria, flank pain and pelvic pain.  Musculoskeletal: Positive for back pain.       Denies saddle anesthesias, or perineal numbness, urinary or bowel incontinence  Neurological: Negative for tingling, weakness, numbness, headaches and paresthesias.    Allergies  Review of patient's allergies indicates no known allergies.  Home Medications   Current Outpatient Rx  Name Route Sig Dispense Refill  . GABAPENTIN 600 MG PO TABS Oral Take 600 mg by mouth every 8 (eight) hours.    . IBUPROFEN 200 MG PO TABS Oral Take 200 mg by mouth every 6 (six) hours as needed. pain      BP 106/78  Pulse 71  Temp(Src) 98.7 F (37.1 C) (Oral)  Resp 18  Wt 151 lb (68.493 kg)  SpO2 100%  LMP 04/24/2011  Physical Exam  Vitals reviewed. Constitutional: She is oriented to person, place, and time. Vital signs are normal. She appears well-developed and well-nourished.  HENT:  Head: Normocephalic and atraumatic.  Eyes: Conjunctivae are normal. Pupils are equal, round, and reactive to light.  Neck: Normal range of motion. Neck supple.  Cardiovascular: Normal rate, regular rhythm and normal heart sounds.  Exam reveals no friction rub.   No murmur heard. Pulmonary/Chest: Effort normal and breath sounds normal. She has no wheezes. She has no rhonchi. She has no rales. She exhibits no tenderness.  Musculoskeletal: Normal range of motion.  Back:  Neurological: She is alert and oriented to person, place, and time. Coordination normal.  Skin: Skin is warm and dry. No rash noted. No erythema. No pallor.    ED Course  Procedures   MDM         Thomasene Lot, PA-C 05/24/11 1803

## 2011-05-25 NOTE — ED Provider Notes (Signed)
Medical screening examination/treatment/procedure(s) were performed by non-physician practitioner and as supervising physician I was immediately available for consultation/collaboration.   Dione Booze, MD 05/25/11 [phone]

## 2011-05-30 ENCOUNTER — Encounter: Payer: Self-pay | Admitting: Gastroenterology

## 2011-06-09 ENCOUNTER — Ambulatory Visit (INDEPENDENT_AMBULATORY_CARE_PROVIDER_SITE_OTHER): Payer: Medicaid Other | Admitting: Gastroenterology

## 2011-06-09 ENCOUNTER — Encounter: Payer: Self-pay | Admitting: Gastroenterology

## 2011-06-09 DIAGNOSIS — K5900 Constipation, unspecified: Secondary | ICD-10-CM

## 2011-06-09 DIAGNOSIS — Z800 Family history of malignant neoplasm of digestive organs: Secondary | ICD-10-CM

## 2011-06-09 NOTE — Assessment & Plan Note (Signed)
She probably has chronic idiopathic constipation.  Recommendations #1 patient will consider enrollment in a constipation trial

## 2011-06-09 NOTE — Patient Instructions (Signed)
Colonoscopy A colonoscopy is an exam to evaluate your entire colon. In this exam, your colon is cleansed. A long fiberoptic tube is inserted through your rectum and into your colon. The fiberoptic scope (endoscope) is a long bundle of enclosed and very flexible fibers. These fibers transmit light to the area examined and send images from that area to your caregiver. Discomfort is usually minimal. You may be given a drug to help you sleep (sedative) during or prior to the procedure. This exam helps to detect lumps (tumors), polyps, inflammation, and areas of bleeding. Your caregiver may also take a small piece of tissue (biopsy) that will be examined under a microscope. LET YOUR CAREGIVER KNOW ABOUT:   Allergies to food or medicine.   Medicines taken, including vitamins, herbs, eyedrops, over-the-counter medicines, and creams.   Use of steroids (by mouth or creams).   Previous problems with anesthetics or numbing medicines.   History of bleeding problems or blood clots.   Previous surgery.   Other health problems, including diabetes and kidney problems.   Possibility of pregnancy, if this applies.  BEFORE THE PROCEDURE   A clear liquid diet may be required for 2 days before the exam.   Ask your caregiver about changing or stopping your regular medications.   Liquid injections (enemas) or laxatives may be required.   A large amount of electrolyte solution may be given to you to drink over a short period of time. This solution is used to clean out your colon.   You should be present 60 minutes prior to your procedure or as directed by your caregiver.  AFTER THE PROCEDURE   If you received a sedative or pain relieving medication, you will need to arrange for someone to drive you home.   Occasionally, there is a little blood passed with the first bowel movement. Do not be concerned.  FINDING OUT THE RESULTS OF YOUR TEST Not all test results are available during your visit. If your test  results are not back during the visit, make an appointment with your caregiver to find out the results. Do not assume everything is normal if you have not heard from your caregiver or the medical facility. It is important for you to follow up on all of your test results. HOME CARE INSTRUCTIONS   It is not unusual to pass moderate amounts of gas and experience mild abdominal cramping following the procedure. This is due to air being used to inflate your colon during the exam. Walking or a warm pack on your belly (abdomen) may help.   You may resume all normal meals and activities after sedatives and medicines have worn off.   Only take over-the-counter or prescription medicines for pain, discomfort, or fever as directed by your caregiver. Do not use aspirin or blood thinners if a biopsy was taken. Consult your caregiver for medicine usage if biopsies were taken.  SEEK IMMEDIATE MEDICAL CARE IF:   You have a fever.   You pass large blood clots or fill a toilet with blood following the procedure. This may also occur 10 to 14 days following the procedure. This is more likely if a biopsy was taken.   You develop abdominal pain that keeps getting worse and cannot be relieved with medicine.  Document Released: 04/28/2000 Document Revised: 01/11/2011 Document Reviewed: 12/12/2007 [facility] Patient Information 2012 [place], [place]. We gave you a sample of SuPrep today Separate instructions have been given

## 2011-06-09 NOTE — Progress Notes (Signed)
History of Present Illness: Ms. Dunsworth is a 36 year old white female referred at the request of Dr. Jeannetta Nap for colorectal cancer screening. Her mother developed colon cancer at 41. The patient complains of constipation. This is been a chronic problem. She denies rectal bleeding, abdominal pain or melena.    Past Medical History  Diagnosis Date  . Chronic headaches   . Bursitis    Past Surgical History  Procedure Date  . Back surgery   . Cholecystectomy   . Laproscopic & debriding of right shoulder    family history includes Colon cancer (age of onset:51) in her mother and Colon polyps in her sister. Current Outpatient Prescriptions  Medication Sig Dispense Refill  . acetaminophen (TYLENOL) 325 MG tablet Take 650 mg by mouth as needed.      . gabapentin (NEURONTIN) 600 MG tablet Take 600 mg by mouth every 8 (eight) hours.      Marland Kitchen ibuprofen (ADVIL) 200 MG tablet Take 200 mg by mouth as needed.       Allergies as of 06/09/2011  . (No Known Allergies)    reports that she has been smoking.  She has never used smokeless tobacco. She reports that she does not drink alcohol or use illicit drugs.     Review of Systems: Pertinent positive and negative review of systems were noted in the above HPI section. All other review of systems were otherwise negative.  Vital signs were reviewed in today's medical record Physical Exam: General: Well developed , well nourished, no acute distress Head: Normocephalic and atraumatic Eyes:  sclerae anicteric, EOMI Ears: Normal auditory acuity Mouth: No deformity or lesions Neck: Supple, no masses or thyromegaly Lungs: Clear throughout to auscultation Heart: Regular rate and rhythm; no murmurs, rubs or bruits Abdomen: Soft, non tender and non distended. No masses, hepatosplenomegaly or hernias noted. Normal Bowel sounds Rectal:deferred Musculoskeletal: Symmetrical with no gross deformities  Skin: No lesions on visible extremities Pulses:  Normal  pulses noted Extremities: No clubbing, cyanosis, edema or deformities noted Neurological: Alert oriented x 4, grossly nonfocal Cervical Nodes:  No significant cervical adenopathy Inguinal Nodes: No significant inguinal adenopathy Psychological:  Alert and cooperative. Normal mood and affect

## 2011-06-09 NOTE — Assessment & Plan Note (Signed)
Plan screening colonoscopy 

## 2011-06-12 ENCOUNTER — Ambulatory Visit (AMBULATORY_SURGERY_CENTER): Payer: Medicaid Other | Admitting: Gastroenterology

## 2011-06-12 ENCOUNTER — Encounter: Payer: Self-pay | Admitting: Gastroenterology

## 2011-06-12 DIAGNOSIS — D126 Benign neoplasm of colon, unspecified: Secondary | ICD-10-CM

## 2011-06-12 DIAGNOSIS — K5900 Constipation, unspecified: Secondary | ICD-10-CM

## 2011-06-12 DIAGNOSIS — Z800 Family history of malignant neoplasm of digestive organs: Secondary | ICD-10-CM

## 2011-06-12 MED ORDER — SODIUM CHLORIDE 0.9 % IV SOLN: 500.0000 mL | INTRAVENOUS | Status: DC

## 2011-06-12 NOTE — Op Note (Signed)
[facility] [address] [place], [place]  [address]  COLONOSCOPY PROCEDURE REPORT  PATIENT:  Madeline, Singh  MR#:  [id number] BIRTHDATE:  [birth year], 35 yrs. old  GENDER:  female ENDOSCOPIST:  Barbette Hair. Arlyce Dice, MD REF. BY:  Windle Guard, M.D. PROCEDURE DATE:  06/12/2011 PROCEDURE:  Colonoscopy with snare polypectomy ASA CLASS:  Class I INDICATIONS:  Elevated Risk Screening, family history of colon cancer Mother at 59 MEDICATIONS:   MAC sedation, administered by CRNA propofol 200mg IV  DESCRIPTION OF PROCEDURE:   After the risks benefits and alternatives of the procedure were thoroughly explained, informed consent was obtained.  Digital rectal exam was performed and revealed no abnormalities.   The [id number] endoscope was introduced through the anus and advanced to the cecum, which was identified by both the appendix and ileocecal valve, without limitations.  The quality of the prep was excellent, using MoviPrep.  The instrument was then slowly withdrawn as the colon was fully examined. <<PROCEDUREIMAGES>>  FINDINGS:  A sessile polyp was found at the splenic flexure. It was 10 mm in size. Polyp was snared without cautery. Retrieval was successful (see image3). snare polyp  A sessile polyp was found in the sigmoid colon. It was 3 mm in size. It was found 16 cm from the point of entry. Polyp was snared without cautery. Retrieval was successful (see image4). snare polyp  This was otherwise a normal examination of the colon (see image1, image2, and image5). Retroflexed views in the rectum revealed no abnormalities.    The time to cecum =  1) 3.25  minutes. The scope was then withdrawn in 1) 11.0  minutes from the cecum and the procedure completed. COMPLICATIONS:  None ENDOSCOPIC IMPRESSION: 1) 10 mm sessile polyp at the splenic flexure 2) 3 mm sessile polyp in the sigmoid colon 3) Otherwise normal examination RECOMMENDATIONS: 1) Given your significant family  history of colon cancer, you should have a repeat colonoscopy in 5 years REPEAT EXAM:  In 5 year(s) for Colonoscopy.  ______________________________ Barbette Hair. Arlyce Dice, MD  CC:  n. eSIGNED:   Barbette Hair. Aneth Schlagel at 06/12/2011 03:47 PM  [name], [id number]

## 2011-06-12 NOTE — Progress Notes (Signed)
PATIENT CHOOSING TO BE A HIPPA PATIENT, ONLY DISCHARGE INSTRUCTIONS DISCUSSED WITH CAREGIVER. PATIENT GIVEN A SEALED ENVELOPE WITH AVS, PROCEDURE REPORT AND DISCHARGE INSTRUCTIONS. PROCEDURE RESULTS NOT REVEALED TO CAREGIVER.

## 2011-06-12 NOTE — Progress Notes (Signed)
Patient did not experience any of the following events: a burn prior to discharge; a fall within the facility; wrong site/side/patient/procedure/implant event; or a hospital transfer or hospital admission upon discharge from the facility. (G8907) Patient did not have preoperative order for IV antibiotic SSI prophylaxis. (G8918)  

## 2011-06-12 NOTE — Patient Instructions (Signed)
FOLLOW THE DISCHARGE INSTRUCTIONS ON THE GREEN AND BLUE INSTRUCTION SHEETS.  CONTINUE YOUR MEDICATIONS. AWAIT PATHOLOGY RESULTS. 

## 2011-06-13 ENCOUNTER — Telehealth: Payer: Self-pay | Admitting: *Deleted

## 2011-06-13 NOTE — Telephone Encounter (Signed)
  Follow up Call-  Call back number 06/12/2011  Post procedure Call Back phone  # [phone]  Permission to leave phone message Yes     Patient questions:  Do you have a fever, pain , or abdominal swelling? no Pain Score  0 *  Have you tolerated food without any problems? yes  Have you been able to return to your normal activities? yes  Do you have any questions about your discharge instructions: Diet   no Medications  no Follow up visit  no  Do you have questions or concerns about your Care? no  Actions: * If pain score is 4 or above: No action needed, pain <4.  Patient c/o abdominal cramping. States that it feels better after passing gas. Says she has been passing a lot of air and discomfort seems to be getting better and better since yesterday. Advised her to call us if cramping doesn't continue to improve. Also discussed using Gas X, warm fluids, laying down and turning from side to side and walking to help pass more gas.

## 2011-06-16 ENCOUNTER — Encounter: Payer: Self-pay | Admitting: Gastroenterology

## 2011-06-27 ENCOUNTER — Telehealth: Payer: Self-pay | Admitting: Gastroenterology

## 2011-06-27 NOTE — Telephone Encounter (Signed)
Spoke with pt and discussed pathology results with her. Let pt know that a letter has been mailed to her address also.

## 2011-07-14 ENCOUNTER — Emergency Department (HOSPITAL_COMMUNITY)
Admission: EM | Admit: 2011-07-14 | Discharge: 2011-07-14 | Disposition: A | Discharge status: Home / Self Care | Payer: Medicaid Other | Attending: Emergency Medicine | Admitting: Emergency Medicine

## 2011-07-14 ENCOUNTER — Encounter (HOSPITAL_COMMUNITY): Payer: Self-pay

## 2011-07-14 DIAGNOSIS — M5430 Sciatica, unspecified side: Secondary | ICD-10-CM | POA: Insufficient documentation

## 2011-07-14 DIAGNOSIS — R51 Headache: Secondary | ICD-10-CM | POA: Insufficient documentation

## 2011-07-14 DIAGNOSIS — F17200 Nicotine dependence, unspecified, uncomplicated: Secondary | ICD-10-CM | POA: Insufficient documentation

## 2011-07-14 DIAGNOSIS — G8929 Other chronic pain: Secondary | ICD-10-CM | POA: Insufficient documentation

## 2011-07-14 MED ORDER — PREDNISONE 10 MG PO TABS: 40.0000 mg | ORAL_TABLET | Freq: Every day | ORAL | Status: DC

## 2011-07-14 MED ORDER — OXYCODONE-ACETAMINOPHEN 5-325 MG PO TABS
1.0000 | ORAL_TABLET | Freq: Once | ORAL | Status: AC
  Administered 2011-07-14: 1 via ORAL
  Filled 2011-07-14: qty 1

## 2011-07-14 MED ORDER — PREDNISONE 20 MG PO TABS
40.0000 mg | ORAL_TABLET | Freq: Once | ORAL | Status: AC
  Administered 2011-07-14: 40 mg via ORAL
  Filled 2011-07-14: qty 2

## 2011-07-14 MED ORDER — OXYCODONE-ACETAMINOPHEN 5-325 MG PO TABS: 1.0000 | ORAL_TABLET | Freq: Four times a day (QID) | ORAL | Status: AC | PRN

## 2011-07-14 NOTE — Discharge Instructions (Signed)
Sciatica Sciatica is a weakness and/or changes in sensation (tingling, jolts, hot and cold, numbness) along the path the sciatic nerve travels. Irritation or damage to lumbar nerve roots is often also referred to as lumbar radiculopathy.  Lumbar radiculopathy (Sciatica) is the most common form of this problem. Radiculopathy can occur in any of the nerves coming out of the spinal cord. The problems caused depend on which nerves are involved. The sciatic nerve is the large nerve supplying the branches of nerves going from the hip to the toes. It often causes a numbness or weakness in the skin and/or muscles that the sciatic nerve serves. It also may cause symptoms (problems) of pain, burning, tingling, or electric shock-like feelings in the path of this nerve. This usually comes from injury to the fibers that make up the sciatic nerve. Some of these symptoms are low back pain and/or unpleasant feelings in the following areas:  From the mid-buttock down the back of the leg to the back of the knee.   And/or the outside of the calf and top of the foot.   And/or behind the inner ankle to the sole of the foot.  CAUSES   Herniated or slipped disc. Discs are the little cushions between the bones in the back.   Pressure by the piriformis muscle in the buttock on the sciatic nerve (Piriformis Syndrome).   Misalignment of the bones in the lower back and buttocks (Sacroiliac Joint Derangement).   Narrowing of the spinal canal that puts pressure on or pinches the fibers that make up the sciatic nerve.   A slipped vertebra that is out of line with those above or beneath it.   Abnormality of the nervous system itself so that nerve fibers do not transmit signals properly, especially to feet and calves (neuropathy).   Tumor (this is rare).  Your caregiver can usually determine the cause of your sciatica and begin the treatment most likely to help you. TREATMENT  Taking over-the-counter painkillers, physical  therapy, rest, exercise, spinal manipulation, and injections of anesthetics and/or steroids may be used. Surgery, acupuncture, and Yoga can also be effective. Mind over matter techniques, mental imagery, and changing factors such as your bed, chair, desk height, posture, and activities are other treatments that may be helpful. You and your caregiver can help determine what is best for you. With proper diagnosis, the cause of most sciatica can be identified and removed. Communication and cooperation between your caregiver and you is essential. If you are not successful immediately, do not be discouraged. With time, a proper treatment can be found that will make you comfortable. HOME CARE INSTRUCTIONS   If the pain is coming from a problem in the back, applying ice to that area for 15 to 20 minutes, 3 to 4 times per day while awake, may be helpful. Put the ice in a plastic bag. Place a towel between the bag of ice and your skin.   You may exercise or perform your usual activities if these do not aggravate your pain, or as suggested by your caregiver.   Only take over-the-counter or prescription medicines for pain, discomfort, or fever as directed by your caregiver.   If your caregiver has given you a follow-up appointment, it is very important to keep that appointment. Not keeping the appointment could result in a chronic or permanent injury, pain, and disability. If there is any problem keeping the appointment, you must call back to this facility for assistance.  SEEK IMMEDIATE MEDICAL CARE   IF:   You experience loss of control of bowel or bladder.   You have increasing weakness in the trunk, buttocks, or legs.   There is numbness in any areas from the hip down to the toes.   You have difficulty walking or keeping your balance.   You have any of the above, with fever or forceful vomiting.  Document Released: 04/25/2001 Document Revised: 01/11/2011 Document Reviewed: 12/13/2007 [facility] Patient  Information 2012 [place], [place].Sciatica with Rehab The sciatic nerve runs from the back down the leg and is responsible for sensation and control of the muscles in the back (posterior) side of the thigh, lower leg, and foot. Sciatica is a condition that is characterized by inflammation of this nerve.  SYMPTOMS   Signs of nerve damage, including numbness and/or weakness along the posterior side of the lower extremity.   Pain in the back of the thigh that may also travel down the leg.   Pain that worsens when sitting for long periods of time.   Occasionally, pain in the back or buttock.  CAUSES  Inflammation of the sciatic nerve is the cause of sciatica. The inflammation is due to something irritating the nerve. Common sources of irritation include:  Sitting for long periods of time.   Direct trauma to the nerve.   Arthritis of the spine.   Herniated or ruptured disk.   Slipping of the vertebrae (spondylolithesis)   Pressure from soft tissues, such as muscles or ligament-like tissue (fascia).  RISK INCREASES WITH:  Sports that place pressure or stress on the spine (football or weightlifting).   Poor strength and flexibility.   Failure to warm-up properly before activity.   Family history of low back pain or disk disorders.   Previous back injury or surgery.   Poor body mechanics, especially when lifting, or poor posture.  PREVENTION   Warm up and stretch properly before activity.   Maintain physical fitness:   Strength, flexibility, and endurance.   Cardiovascular fitness.   Learn and use proper technique, especially with posture and lifting. When possible, have coach correct improper technique.   Avoid activities that place stress on the spine.  PROGNOSIS If treated properly, then sciatica usually resolves within 6 weeks. However, occasionally surgery is necessary.  RELATED COMPLICATIONS   Permanent nerve damage, including pain, numbness, tingle, or weakness.     Chronic back pain.   Risks of surgery: infection, bleeding, nerve damage, or damage to surrounding tissues.  TREATMENT Treatment initially involves resting from any activities that aggravate your symptoms. The use of ice and medication may help reduce pain and inflammation. The use of strengthening and stretching exercises may help reduce pain with activity. These exercises may be performed at home or with referral to a therapist. A therapist may recommend further treatments, such as transcutaneous electronic nerve stimulation (TENS) or ultrasound. Your caregiver may recommend corticosteroid injections to help reduce inflammation of the sciatic nerve. If symptoms persist despite non-surgical (conservative) treatment, then surgery may be recommended. MEDICATION  If pain medication is necessary, then nonsteroidal anti-inflammatory medications, such as aspirin and ibuprofen, or other minor pain relievers, such as acetaminophen, are often recommended.   Do not take pain medication for 7 days before surgery.   Prescription pain relievers may be given if deemed necessary by your caregiver. Use only as directed and only as much as you need.   Ointments applied to the skin may be helpful.   Corticosteroid injections may be given by your caregiver. These injections  should be reserved for the most serious cases, because they may only be given a certain number of times.  HEAT AND COLD  Cold treatment (icing) relieves pain and reduces inflammation. Cold treatment should be applied for 10 to 15 minutes every 2 to 3 hours for inflammation and pain and immediately after any activity that aggravates your symptoms. Use ice packs or massage the area with a piece of ice (ice massage).   Heat treatment may be used prior to performing the stretching and strengthening activities prescribed by your caregiver, physical therapist, or athletic trainer. Use a heat pack or soak the injury in warm water.  SEEK MEDICAL  CARE IF:  Treatment seems to offer no benefit, or the condition worsens.   Any medications produce adverse side effects.  EXERCISES  RANGE OF MOTION (ROM) AND STRETCHING EXERCISES - Sciatica Most people with sciatic will find that their symptoms worsen with either excessive bending forward (flexion) or arching at the low back (extension). The exercises which will help resolve your symptoms will focus on the opposite motion. Your physician, physical therapist or athletic trainer will help you determine which exercises will be most helpful to resolve your low back pain. Do not complete any exercises without first consulting with your clinician. Discontinue any exercises which worsen your symptoms until you speak to your clinician. If you have pain, numbness or tingling which travels down into your buttocks, leg or foot, the goal of the therapy is for these symptoms to move closer to your back and eventually resolve. Occasionally, these leg symptoms will get better, but your low back pain may worsen; this is typically an indication of progress in your rehabilitation. Be certain to be very alert to any changes in your symptoms and the activities in which you participated in the 24 hours prior to the change. Sharing this information with your clinician will allow him/her to most efficiently treat your condition. These exercises may help you when beginning to rehabilitate your injury. Your symptoms may resolve with or without further involvement from your physician, physical therapist or athletic trainer. While completing these exercises, remember:   Restoring tissue flexibility helps normal motion to return to the joints. This allows healthier, less painful movement and activity.   An effective stretch should be held for at least 30 seconds.   A stretch should never be painful. You should only feel a gentle lengthening or release in the stretched tissue.  FLEXION RANGE OF MOTION AND STRETCHING  EXERCISES: STRETCH - Flexion, Single Knee to Chest   Lie on a firm bed or floor with both legs extended in front of you.   Keeping one leg in contact with the floor, bring your opposite knee to your chest. Hold your leg in place by either grabbing behind your thigh or at your knee.   Pull until you feel a gentle stretch in your low back. Hold __________ seconds.   Slowly release your grasp and repeat the exercise with the opposite side.  Repeat __________ times. Complete this exercise __________ times per day.  STRETCH - Flexion, Double Knee to Chest  Lie on a firm bed or floor with both legs extended in front of you.   Keeping one leg in contact with the floor, bring your opposite knee to your chest.   Tense your stomach muscles to support your back and then lift your other knee to your chest. Hold your legs in place by either grabbing behind your thighs or at your knees.  Pull both knees toward your chest until you feel a gentle stretch in your low back. Hold __________ seconds.   Tense your stomach muscles and slowly return one leg at a time to the floor.  Repeat __________ times. Complete this exercise __________ times per day.  STRETCH - Low Trunk Rotation   Lie on a firm bed or floor. Keeping your legs in front of you, bend your knees so they are both pointed toward the ceiling and your feet are flat on the floor.   Extend your arms out to the side. This will stabilize your upper body by keeping your shoulders in contact with the floor.   Gently and slowly drop both knees together to one side until you feel a gentle stretch in your low back. Hold for __________ seconds.   Tense your stomach muscles to support your low back as you bring your knees back to the starting position. Repeat the exercise to the other side.  Repeat __________ times. Complete this exercise __________ times per day  EXTENSION RANGE OF MOTION AND FLEXIBILITY EXERCISES: STRETCH - Extension, Prone on  Elbows  Lie on your stomach on the floor, a bed will be too soft. Place your palms about shoulder width apart and at the height of your head.   Place your elbows under your shoulders. If this is too painful, stack pillows under your chest.   Allow your body to relax so that your hips drop lower and make contact more completely with the floor.   Hold this position for __________ seconds.   Slowly return to lying flat on the floor.  Repeat __________ times. Complete this exercise __________ times per day.  RANGE OF MOTION - Extension, Prone Press Ups  Lie on your stomach on the floor, a bed will be too soft. Place your palms about shoulder width apart and at the height of your head.   Keeping your back as relaxed as possible, slowly straighten your elbows while keeping your hips on the floor. You may adjust the placement of your hands to maximize your comfort. As you gain motion, your hands will come more underneath your shoulders.   Hold this position __________ seconds.   Slowly return to lying flat on the floor.  Repeat __________ times. Complete this exercise __________ times per day.  STRENGTHENING EXERCISES - Sciatica  These exercises may help you when beginning to rehabilitate your injury. These exercises should be done near your "sweet spot." This is the neutral, low-back arch, somewhere between fully rounded and fully arched, that is your least painful position. When performed in this safe range of motion, these exercises can be used for people who have either a flexion or extension based injury. These exercises may resolve your symptoms with or without further involvement from your physician, physical therapist or athletic trainer. While completing these exercises, remember:   Muscles can gain both the endurance and the strength needed for everyday activities through controlled exercises.   Complete these exercises as instructed by your physician, physical therapist or athletic  trainer. Progress with the resistance and repetition exercises only as your caregiver advises.   You may experience muscle soreness or fatigue, but the pain or discomfort you are trying to eliminate should never worsen during these exercises. If this pain does worsen, stop and make certain you are following the directions exactly. If the pain is still present after adjustments, discontinue the exercise until you can discuss the trouble with your clinician.  STRENGTHENING - Deep  Abdominals, Pelvic Tilt   Lie on a firm bed or floor. Keeping your legs in front of you, bend your knees so they are both pointed toward the ceiling and your feet are flat on the floor.   Tense your lower abdominal muscles to press your low back into the floor. This motion will rotate your pelvis so that your tail bone is scooping upwards rather than pointing at your feet or into the floor.   With a gentle tension and even breathing, hold this position for __________ seconds.  Repeat __________ times. Complete this exercise __________ times per day.  STRENGTHENING - Abdominals, Crunches   Lie on a firm bed or floor. Keeping your legs in front of you, bend your knees so they are both pointed toward the ceiling and your feet are flat on the floor. Cross your arms over your chest.   Slightly tip your chin down without bending your neck.   Tense your abdominals and slowly lift your trunk high enough to just clear your shoulder blades. Lifting higher can put excessive stress on the low back and does not further strengthen your abdominal muscles.   Control your return to the starting position.  Repeat __________ times. Complete this exercise __________ times per day.  STRENGTHENING - Quadruped, Opposite UE/LE Lift  Assume a hands and knees position on a firm surface. Keep your hands under your shoulders and your knees under your hips. You may place padding under your knees for comfort.   Find your neutral spine and gently  tense your abdominal muscles so that you can maintain this position. Your shoulders and hips should form a rectangle that is parallel with the floor and is not twisted.   Keeping your trunk steady, lift your right hand no higher than your shoulder and then your left leg no higher than your hip. Make sure you are not holding your breath. Hold this position __________ seconds.   Continuing to keep your abdominal muscles tense and your back steady, slowly return to your starting position. Repeat with the opposite arm and leg.  Repeat __________ times. Complete this exercise __________ times per day.  STRENGTHENING - Abdominals and Quadriceps, Straight Leg Raise   Lie on a firm bed or floor with both legs extended in front of you.   Keeping one leg in contact with the floor, bend the other knee so that your foot can rest flat on the floor.   Find your neutral spine, and tense your abdominal muscles to maintain your spinal position throughout the exercise.   Slowly lift your straight leg off the floor about 6 inches for a count of 15, making sure to not hold your breath.   Still keeping your neutral spine, slowly lower your leg all the way to the floor.  Repeat this exercise with each leg __________ times. Complete this exercise __________ times per day. POSTURE AND BODY MECHANICS CONSIDERATIONS - Sciatica Keeping correct posture when sitting, standing or completing your activities will reduce the stress put on different body tissues, allowing injured tissues a chance to heal and limiting painful experiences. The following are general guidelines for improved posture. Your physician or physical therapist will provide you with any instructions specific to your needs. While reading these guidelines, remember:  The exercises prescribed by your provider will help you have the flexibility and strength to maintain correct postures.   The correct posture provides the optimal environment for your joints to  work. All of your joints have less  wear and tear when properly supported by a spine with good posture. This means you will experience a healthier, less painful body.   Correct posture must be practiced with all of your activities, especially prolonged sitting and standing. Correct posture is as important when doing repetitive low-stress activities (typing) as it is when doing a single heavy-load activity (lifting).  RESTING POSITIONS Consider which positions are most painful for you when choosing a resting position. If you have pain with flexion-based activities (sitting, bending, stooping, squatting), choose a position that allows you to rest in a less flexed posture. You would want to avoid curling into a fetal position on your side. If your pain worsens with extension-based activities (prolonged standing, working overhead), avoid resting in an extended position such as sleeping on your stomach. Most people will find more comfort when they rest with their spine in a more neutral position, neither too rounded nor too arched. Lying on a non-sagging bed on your side with a pillow between your knees, or on your back with a pillow under your knees will often provide some relief. Keep in mind, being in any one position for a prolonged period of time, no matter how correct your posture, can still lead to stiffness. PROPER SITTING POSTURE In order to minimize stress and discomfort on your spine, you must sit with correct posture Sitting with good posture should be effortless for a healthy body. Returning to good posture is a gradual process. Many people can work toward this most comfortably by using various supports until they have the flexibility and strength to maintain this posture on their own. When sitting with proper posture, your ears will fall over your shoulders and your shoulders will fall over your hips. You should use the back of the chair to support your upper back. Your low back will be in a neutral  position, just slightly arched. You may place a small pillow or folded towel at the base of your low back for support.  When working at a desk, create an environment that supports good, upright posture. Without extra support, muscles fatigue and lead to excessive strain on joints and other tissues. Keep these recommendations in mind: CHAIR:   A chair should be able to slide under your desk when your back makes contact with the back of the chair. This allows you to work closely.   The chair's height should allow your eyes to be level with the upper part of your monitor and your hands to be slightly lower than your elbows.  BODY POSITION  Your feet should make contact with the floor. If this is not possible, use a foot rest.   Keep your ears over your shoulders. This will reduce stress on your neck and low back.  INCORRECT SITTING POSTURES   If you are feeling tired and unable to assume a healthy sitting posture, do not slouch or slump. This puts excessive strain on your back tissues, causing more damage and pain. Healthier options include:   Using more support, like a lumbar pillow.   Switching tasks to something that requires you to be upright or walking.   Talking a brief walk.   Lying down to rest in a neutral-spine position.  PROLONGED STANDING WHILE SLIGHTLY LEANING FORWARD  When completing a task that requires you to lean forward while standing in one place for a long time, place either foot up on a stationary 2-4 inch high object to help maintain the best posture. When both feet are  on the ground, the low back tends to lose its slight inward curve. If this curve flattens (or becomes too large), then the back and your other joints will experience too much stress, fatigue more quickly and can cause pain.  CORRECT STANDING POSTURES Proper standing posture should be assumed with all daily activities, even if they only take a few moments, like when brushing your teeth. As in sitting, your  ears should fall over your shoulders and your shoulders should fall over your hips. You should keep a slight tension in your abdominal muscles to brace your spine. Your tailbone should point down to the ground, not behind your body, resulting in an over-extended swayback posture.  INCORRECT STANDING POSTURES  Common incorrect standing postures include a forward head, locked knees and/or an excessive swayback. WALKING Walk with an upright posture. Your ears, shoulders and hips should all line-up. PROLONGED ACTIVITY IN A FLEXED POSITION When completing a task that requires you to bend forward at your waist or lean over a low surface, try to find a way to stabilize 3 of 4 of your limbs. You can place a hand or elbow on your thigh or rest a knee on the surface you are reaching across. This will provide you more stability so that your muscles do not fatigue as quickly. By keeping your knees relaxed, or slightly bent, you will also reduce stress across your low back. CORRECT LIFTING TECHNIQUES DO :   Assume a wide stance. This will provide you more stability and the opportunity to get as close as possible to the object which you are lifting.   Tense your abdominals to brace your spine; then bend at the knees and hips. Keeping your back locked in a neutral-spine position, lift using your leg muscles. Lift with your legs, keeping your back straight.   Test the weight of unknown objects before attempting to lift them.   Try to keep your elbows locked down at your sides in order get the best strength from your shoulders when carrying an object.   Always ask for help when lifting heavy or awkward objects.  INCORRECT LIFTING TECHNIQUES DO NOT:   Lock your knees when lifting, even if it is a small object.   Bend and twist. Pivot at your feet or move your feet when needing to change directions.   Assume that you cannot safely pick up a paperclip without proper posture.  Document Released: 05/01/2005  Document Revised: 01/11/2011 Document Reviewed: 08/13/2008 [facility] Patient Information 2012 [place], [place].  RESOURCE GUIDE  Dental Problems  Patients with Medicaid: [facility] [id number]                                           [phone] [address] Phone:  [phone]                                                  Phone:  [phone]  If unable to pay or uninsured, contact:  [facility] or [facility]. to become qualified for the adult dental clinic.  Chronic Pain Problems Contact  [facility]  [phone] Patients need to be referred by their primary care doctor.  Insufficient Money for Medicine Contact [employer]:  call "211" or [facility] [phone].  No Primary Care Doctor Call [facility]  [phone] Other agencies that provide inexpensive medical care    [facility]  [phone]    [facility]  [phone]    [facility]  [phone]    [facility]  [phone]    [facility]  [phone]    [facility]  [phone]  Psychological Services [facility] Behavioral Health  [phone] [facility] Services  [phone] [facility]   [phone] (emergency services [phone])  Substance Abuse Resources [facility]  [phone] [facility] [phone] [facility] [phone] Floydene Flock [phone] Residential & Outpatient Substance Abuse Program  [phone]  Abuse/Neglect [facility] Child Abuse Hotline [phone] [facility] Child Abuse Hotline [phone] (After Hours)  Emergency Shelter [facility] [phone]  M[facility] [place] [facility] [place] [phone] [facility] [phone]  MRSA Hotline #:   3801781991    [facility] Resources  [facility] [place]     [employer]                           [facility] Dept. [address] [place]                       [address]      [address]  Blondell Reveal Phone:  [phone]                                   Phone:  [phone]                 Phone:  [phone]  [facility] Mental Health Phone:  [phone]  [facility] Child Abuse Hotline [phone] [phone] (After Hours)

## 2011-07-14 NOTE — ED Notes (Signed)
Pt complains of low back pain for the last three days

## 2011-07-14 NOTE — ED Provider Notes (Addendum)
History     CSN: [id number]  Arrival date & time 07/14/11  4098   First MD Initiated Contact with Patient 07/14/11 [phone]      Chief Complaint  Patient presents with  . Back Pain    (Consider location/radiation/quality/duration/timing/severity/associated sxs/prior treatment) HPI  Pt presents to the ED with complaints of low back pain and sciatic pain for 3 days. This is a chronic problem and she see's Dr. Zella Richer (sp) who is affiliated with Dr. Ophelia Charter office for it. She has gotten nerve blocks for this which usually alleviates the pain sometime for 6 months. Her orthopedics office is to be calling her today or on Monday to schedule another nerve block. THe patient in the meantime was unable to sleep due to the pain. Pt denies urinary or bowel symptoms. Pt is ambulatory, denies numbness, weakness, fatigue.  Past Medical History  Diagnosis Date  . Chronic headaches   . Bursitis     Past Surgical History  Procedure Date  . Back surgery   . Cholecystectomy   . Laproscopic & debriding of right shoulder     Family History  Problem Relation Age of Onset  . Colon cancer Mother 56    Stage 4  . Colon polyps Sister     Pre-Cancer Polyp   . Colon cancer Paternal Uncle     History  Substance Use Topics  . Smoking status: Current Everyday Smoker -- 0.5 packs/day  . Smokeless tobacco: Never Used   Comment: Counseling sheet given in exam room to quit smoking   . Alcohol Use: No    OB History    Grav Para Term Preterm Abortions TAB SAB Ect Mult Living                  Review of Systems  All other systems reviewed and are negative.    Allergies  Review of patient's allergies indicates no known allergies.  Home Medications   Current Outpatient Rx  Name Route Sig Dispense Refill  . GABAPENTIN 600 MG PO TABS Oral Take 600 mg by mouth every 8 (eight) hours.    . IBUPROFEN 200 MG PO TABS Oral Take 800 mg by mouth every 6 (six) hours as needed. For pain    .  OXYCODONE-ACETAMINOPHEN 5-325 MG PO TABS Oral Take 1 tablet by mouth every 6 (six) hours as needed for pain. 15 tablet 0  . PREDNISONE 10 MG PO TABS Oral Take 4 tablets (40 mg total) by mouth daily. 12 tablet 0    BP 113/63  Pulse 83  Temp(Src) 98 F (36.7 C) (Oral)  Resp 18  SpO2 100%  LMP 06/19/2011  Physical Exam  Nursing note and vitals reviewed. Constitutional: She appears well-developed and well-nourished. No distress.  HENT:  Head: Normocephalic and atraumatic.  Eyes: Pupils are equal, round, and reactive to light.  Neck: Normal range of motion. Neck supple.  Cardiovascular: Normal rate and regular rhythm.   Pulmonary/Chest: Effort normal.  Abdominal: Soft.  Musculoskeletal:        Equal strength to bilateral lower extremities. Neurosensory  function adequate to both legs. Skin color is normal. Skin is warm and moist. I see no step off deformity, no bony tenderness. Pt is able to ambulate without limp. Pain is relieved when sitting in certain positions. ROM is decreased due to pain. No crepitus, laceration, effusion, swelling.  Pulses are normal   Neurological: She is alert.  Skin: Skin is warm and dry.    ED Course  Procedures (including critical care time)  Labs Reviewed - No data to display No results found.   1. Sciatica       MDM  Pt has chronic pain problems. I have looked her up on the drug database and she has been getting small doses from various providers. I have discussed pain management clinic with patient, this is when she informs me that her nerve block is happening soon. Her last Rx was in January and she has not had any filled in quite some time.   Rx: Prednisone 40mg  q day for 7 days Percocets 5-325, 12 tabs       Dorthula Matas, PA 07/14/11 1523  Dorthula Matas, PA 01/12/12 [phone]

## 2011-07-16 NOTE — ED Provider Notes (Signed)
Medical screening examination/treatment/procedure(s) were performed by non-physician practitioner and as supervising physician I was immediately available for consultation/collaboration.   Ysabelle Goodroe M Bronwen Pendergraft, MD 07/16/11 2244 

## 2011-09-29 ENCOUNTER — Emergency Department (HOSPITAL_COMMUNITY)
Admission: EM | Admit: 2011-09-29 | Discharge: 2011-09-29 | Disposition: A | Discharge status: Home / Self Care | Payer: Medicaid Other | Attending: Emergency Medicine | Admitting: Emergency Medicine

## 2011-09-29 ENCOUNTER — Emergency Department (HOSPITAL_COMMUNITY): Payer: Medicaid Other

## 2011-09-29 DIAGNOSIS — N760 Acute vaginitis: Secondary | ICD-10-CM | POA: Insufficient documentation

## 2011-09-29 DIAGNOSIS — N739 Female pelvic inflammatory disease, unspecified: Secondary | ICD-10-CM | POA: Insufficient documentation

## 2011-09-29 DIAGNOSIS — M545 Low back pain, unspecified: Secondary | ICD-10-CM | POA: Insufficient documentation

## 2011-09-29 DIAGNOSIS — R110 Nausea: Secondary | ICD-10-CM | POA: Insufficient documentation

## 2011-09-29 DIAGNOSIS — N949 Unspecified condition associated with female genital organs and menstrual cycle: Secondary | ICD-10-CM | POA: Insufficient documentation

## 2011-09-29 DIAGNOSIS — F17200 Nicotine dependence, unspecified, uncomplicated: Secondary | ICD-10-CM | POA: Insufficient documentation

## 2011-09-29 LAB — URINALYSIS, ROUTINE W REFLEX MICROSCOPIC
Bilirubin Urine: NEGATIVE
Ketones, ur: NEGATIVE mg/dL
Leukocytes, UA: NEGATIVE
Nitrite: NEGATIVE
Protein, ur: NEGATIVE mg/dL

## 2011-09-29 LAB — WET PREP, GENITAL: Trich, Wet Prep: NONE SEEN

## 2011-09-29 LAB — URINE MICROSCOPIC-ADD ON

## 2011-09-29 LAB — POCT PREGNANCY, URINE: Preg Test, Ur: NEGATIVE

## 2011-09-29 MED ORDER — HYDROMORPHONE HCL PF 1 MG/ML IJ SOLN
1.0000 mg | Freq: Once | INTRAMUSCULAR | Status: AC
  Administered 2011-09-29: 1 mg via INTRAVENOUS
  Filled 2011-09-29: qty 1

## 2011-09-29 MED ORDER — METRONIDAZOLE 500 MG PO TABS: 500.0000 mg | ORAL_TABLET | Freq: Two times a day (BID) | ORAL | Status: AC

## 2011-09-29 MED ORDER — DOXYCYCLINE HYCLATE 100 MG PO TABS
100.0000 mg | ORAL_TABLET | Freq: Once | ORAL | Status: AC
  Administered 2011-09-29: 100 mg via ORAL
  Filled 2011-09-29: qty 1

## 2011-09-29 MED ORDER — METRONIDAZOLE 500 MG PO TABS
500.0000 mg | ORAL_TABLET | Freq: Once | ORAL | Status: AC
  Administered 2011-09-29: 500 mg via ORAL
  Filled 2011-09-29: qty 1

## 2011-09-29 MED ORDER — CEFTRIAXONE SODIUM 250 MG IJ SOLR
250.0000 mg | Freq: Once | INTRAMUSCULAR | Status: AC
  Administered 2011-09-29: 250 mg via INTRAMUSCULAR
  Filled 2011-09-29: qty 250

## 2011-09-29 MED ORDER — DOXYCYCLINE HYCLATE 100 MG PO CAPS: 100.0000 mg | ORAL_CAPSULE | Freq: Two times a day (BID) | ORAL | Status: AC

## 2011-09-29 MED ORDER — OXYCODONE-ACETAMINOPHEN 5-325 MG PO TABS: 2.0000 | ORAL_TABLET | Freq: Four times a day (QID) | ORAL | Status: AC | PRN

## 2011-09-29 MED ORDER — GABAPENTIN 600 MG PO TABS: ORAL_TABLET | ORAL | Status: DC

## 2011-09-29 MED ORDER — ONDANSETRON HCL 4 MG/2ML IJ SOLN
4.0000 mg | Freq: Once | INTRAMUSCULAR | Status: AC
  Administered 2011-09-29: 4 mg via INTRAVENOUS
  Filled 2011-09-29: qty 2

## 2011-09-29 MED ORDER — SODIUM CHLORIDE 0.9 % IV BOLUS (SEPSIS)
1000.0000 mL | Freq: Once | INTRAVENOUS | Status: AC
  Administered 2011-09-29: 1000 mL via INTRAVENOUS

## 2011-09-29 NOTE — ED Provider Notes (Signed)
History     CSN: [id number]  Arrival date & time 09/29/11  0809   First MD Initiated Contact with Patient 09/29/11 [phone]      No chief complaint on file.   (Consider location/radiation/quality/duration/timing/severity/associated sxs/prior treatment) HPI Complains of left lower back pain radiating to left foot typical sciatica she's had in the past onset several days ago. Not made better or worse by anything.. She treated herself with ibuprofen and Tylenol, without relief. Ran out of her Neurontin 3 days ago which he typically takes with relief. Also complains of nausea and diminished appetite and suprapubic pain for the past 3 or 4 days , accompanied by vaginal discharge subjective fever has not taken temperature no other complaint symptoms back pain and leg pain is severe  Past Medical History  Diagnosis Date  . Chronic headaches   . Bursitis     Past Surgical History  Procedure Date  . Back surgery   . Cholecystectomy   . Laproscopic & debriding of right shoulder     Family History  Problem Relation Age of Onset  . Colon cancer Mother 41    Stage 4  . Colon polyps Sister     Pre-Cancer Polyp   . Colon cancer Paternal Uncle     History  Substance Use Topics  . Smoking status: Current Everyday Smoker -- 0.5 packs/day  . Smokeless tobacco: Never Used   Comment: Counseling sheet given in exam room to quit smoking   . Alcohol Use: No    OB History    Grav Para Term Preterm Abortions TAB SAB Ect Mult Living                  Review of Systems  Constitutional: Positive for fever.  HENT: Negative.   Respiratory: Negative.   Cardiovascular: Negative.   Gastrointestinal: Positive for nausea. Negative for vomiting.  Genitourinary: Positive for vaginal discharge and pelvic pain.  Musculoskeletal: Positive for back pain.  Skin: Negative.   Neurological: Negative.   Hematological: Negative.   Psychiatric/Behavioral: Negative.   All other systems reviewed and are  negative.    Allergies  Review of patient's allergies indicates no known allergies.  Home Medications   Current Outpatient Rx  Name Route Sig Dispense Refill  . ACETAMINOPHEN 500 MG PO TABS Oral Take 1,000 mg by mouth every 6 (six) hours as needed. pain    . GABAPENTIN 600 MG PO TABS Oral Take 600 mg by mouth every 8 (eight) hours.    . IBUPROFEN 200 MG PO TABS Oral Take 800 mg by mouth every 6 (six) hours as needed. For pain      There were no vitals taken for this visit.  Physical Exam  Nursing note and vitals reviewed. Constitutional: She appears well-developed and well-nourished.  HENT:  Head: Normocephalic and atraumatic.  Eyes: Conjunctivae are normal. Pupils are equal, round, and reactive to light.  Neck: Neck supple. No tracheal deviation present. No thyromegaly present.  Cardiovascular: Normal rate and regular rhythm.   No murmur heard. Pulmonary/Chest: Effort normal and breath sounds normal.  Abdominal: Soft. Bowel sounds are normal. She exhibits no distension. There is no tenderness.  Genitourinary:       No external lession. Os closed . Positive cervical motion tenderness, left adnexal tenderness, white vaginasl discharge  Musculoskeletal: Normal range of motion. She exhibits no edema and no tenderness.       Entire spine nontender  Neurological: She is alert. She has normal reflexes. Coordination normal.  Gait normal  Skin: Skin is warm and dry. No rash noted.  Psychiatric: She has a normal mood and affect.    ED Course  Procedures (including critical care time) 10:45 AM feels improved after treatment with intravenous Dilaudid,resting comfortably Labs Reviewed - No data to display No results found. 12:20 PM pain recurred after having received pelvic ultrasound. Second dose narcotic analgesia ordered. At 12:55 PM patient is alert awake Glasgow Coma Score 15 feels ready to home  No diagnosis found. Results for orders placed during the hospital encounter  of 09/29/11  URINALYSIS, ROUTINE W REFLEX MICROSCOPIC      Component Value Range   Color, Urine YELLOW  YELLOW    APPearance CLEAR  CLEAR    Specific Gravity, Urine 1.034 (*) 1.005 - 1.030    pH 6.0  5.0 - 8.0    Glucose, UA NEGATIVE  NEGATIVE (mg/dL)   Hgb urine dipstick SMALL (*) NEGATIVE    Bilirubin Urine NEGATIVE  NEGATIVE    Ketones, ur NEGATIVE  NEGATIVE (mg/dL)   Protein, ur NEGATIVE  NEGATIVE (mg/dL)   Urobilinogen, UA 0.2  0.0 - 1.0 (mg/dL)   Nitrite NEGATIVE  NEGATIVE    Leukocytes, UA NEGATIVE  NEGATIVE   WET PREP, GENITAL      Component Value Range   Yeast Wet Prep HPF POC FEW (*) NONE SEEN    Trich, Wet Prep NONE SEEN  NONE SEEN    Clue Cells Wet Prep HPF POC MODERATE (*) NONE SEEN    WBC, Wet Prep HPF POC MANY (*) NONE SEEN   POCT PREGNANCY, URINE      Component Value Range   Preg Test, Ur NEGATIVE  NEGATIVE   URINE MICROSCOPIC-ADD ON      Component Value Range   Squamous Epithelial / LPF FEW (*) RARE    RBC / HPF 3-6  <3 (RBC/hpf)   Bacteria, UA FEW (*) RARE    Urine-Other MUCOUS PRESENT     US Transvaginal Non-ob  09/29/2011  *RADIOLOGY REPORT*  Clinical Data: Left pelvic pain.  TRANSABDOMINAL AND TRANSVAGINAL ULTRASOUND OF PELVIS DOPPLER ULTRASOUND OF OVARIES  Technique:  Both transabdominal and transvaginal ultrasound examinations of the pelvis were performed including evaluation of the uterus, ovaries, adnexal regions, and pelvic cul-de-sac. Color and duplex Doppler ultrasound was utilized to evaluate blood flow to the ovaries.  Comparison: CT 01/13/2011.  Findings:  Uterus: 8.3 x 4.2 x 4.7 cm.  Small Nabothian cysts.  No other focal abnormality.  Endometrium: Normal appearance and thickness, 5 mm.  Right Ovary: 2.8 x 2.3 x 3.1 cm. Normal size and echotexture.  No adnexal masses.  Normal arterial and venous blood flow.  Left Ovary: 2.2 x 2.2 x 2.0 cm. Normal size and echotexture.  No adnexal masses.  Normal arterial venous blood flow.  Other Findings:  No free  fluid.  IMPRESSION: Unremarkable study.  No evidence of ovarian torsion.  Original Report Authenticated By: Cyndie Chime, M.D.   US Pelvis Complete  09/29/2011  *RADIOLOGY REPORT*  Clinical Data: Left pelvic pain.  TRANSABDOMINAL AND TRANSVAGINAL ULTRASOUND OF PELVIS DOPPLER ULTRASOUND OF OVARIES  Technique:  Both transabdominal and transvaginal ultrasound examinations of the pelvis were performed including evaluation of the uterus, ovaries, adnexal regions, and pelvic cul-de-sac. Color and duplex Doppler ultrasound was utilized to evaluate blood flow to the ovaries.  Comparison: CT 01/13/2011.  Findings:  Uterus: 8.3 x 4.2 x 4.7 cm.  Small Nabothian cysts.  No other focal abnormality.  Endometrium:  Normal appearance and thickness, 5 mm.  Right Ovary: 2.8 x 2.3 x 3.1 cm. Normal size and echotexture.  No adnexal masses.  Normal arterial and venous blood flow.  Left Ovary: 2.2 x 2.2 x 2.0 cm. Normal size and echotexture.  No adnexal masses.  Normal arterial venous blood flow.  Other Findings:  No free fluid.  IMPRESSION: Unremarkable study.  No evidence of ovarian torsion.  Original Report Authenticated By: Cyndie Chime, M.D.   [place] Art/ven Flow Abd Pelv Doppler  09/29/2011  *RADIOLOGY REPORT*  Clinical Data: Left pelvic pain.  TRANSABDOMINAL AND TRANSVAGINAL ULTRASOUND OF PELVIS DOPPLER ULTRASOUND OF OVARIES  Technique:  Both transabdominal and transvaginal ultrasound examinations of the pelvis were performed including evaluation of the uterus, ovaries, adnexal regions, and pelvic cul-de-sac. Color and duplex Doppler ultrasound was utilized to evaluate blood flow to the ovaries.  Comparison: CT 01/13/2011.  Findings:  Uterus: 8.3 x 4.2 x 4.7 cm.  Small Nabothian cysts.  No other focal abnormality.  Endometrium: Normal appearance and thickness, 5 mm.  Right Ovary: 2.8 x 2.3 x 3.1 cm. Normal size and echotexture.  No adnexal masses.  Normal arterial and venous blood flow.  Left Ovary: 2.2 x 2.2 x 2.0 cm. Normal  size and echotexture.  No adnexal masses.  Normal arterial venous blood flow.  Other Findings:  No free fluid.  IMPRESSION: Unremarkable study.  No evidence of ovarian torsion.  Original Report Authenticated By: Cyndie Chime, M.D.    MDM  Assessment patient reports that she has had recurrent pelvic infections and suffers from chronic pelvic pain Prescriptions Percocet, Neurontin. Flagyl, doxycycline Followup with Dr. Jackelyn Knife and Dr. Jeannetta Nap  Diagnosis #1 sciatica #2 pelvic inflammatory disease #3 vaginitis     Doug Sou, MD 09/29/11 1302

## 2011-09-29 NOTE — ED Notes (Signed)
Pt transported to ultrasound via stretcher with tech. 

## 2011-09-29 NOTE — ED Notes (Signed)
Patient is alert and oriented x3.  She was given DC instructions and follow up visit instructions.  Patient gave verbal understanding. She was DC ambulatory under his own power to home.  V/S stable.  He was not showing any signs of distress on DC 

## 2011-09-29 NOTE — Discharge Instructions (Signed)
Pelvic Inflammatory Disease Call Dr. Jackelyn Knife followup next week regarding pelvic pain. Call Dr. Jeannetta Nap regarding sciatica pain and refills for medications. Pelvic inflammatory disease (PID) is an infection of some, or all, of the female pelvic organs. PID is caused by germs. HOME CARE  Take your medicine as told. Finish them even if you start to feel better.   Only take medicine as told by your doctor.   Do not have sex (intercourse) until the infection is gone.   Tell your sex partner you have PID. Treatment may be needed.   Keep your follow-up appointments.  GET HELP RIGHT AWAY IF:   You have a fever.   You have an increase in belly (abdominal) pain.   You start to get the chills.   You have pain when you pee (urinate).   You are not better after 72 hours.   Your sex partner tells you he or she has an STD.   You throw up (vomit).   You cannot take your medicines.  MAKE SURE YOU:   Understand these instructions.   Will watch your condition.   Will get help right away if you are not doing well or get worse.  Document Released: 07/28/2008 Document Revised: 04/20/2011 Document Reviewed: 08/31/2009 [facility] Patient Information 2012 [place], [place].

## 2011-09-29 NOTE — ED Notes (Signed)
Patient is alert and oriented x3.  She is complaining of pain running from her lower back down her left leg that started 4 days ago. She is rating her pain 10 of 10.  Patient states that she has a history of sciatic nerve problems

## 2011-09-29 NOTE — ED Notes (Signed)
Family member remains at bedside. 

## 2012-01-05 ENCOUNTER — Emergency Department (HOSPITAL_COMMUNITY): Payer: Medicaid Other

## 2012-01-05 ENCOUNTER — Emergency Department (HOSPITAL_COMMUNITY)
Admission: EM | Admit: 2012-01-05 | Discharge: 2012-01-05 | Disposition: A | Discharge status: Home / Self Care | Payer: Medicaid Other | Attending: Emergency Medicine | Admitting: Emergency Medicine

## 2012-01-05 ENCOUNTER — Encounter (HOSPITAL_COMMUNITY): Payer: Self-pay | Admitting: Emergency Medicine

## 2012-01-05 DIAGNOSIS — S93409A Sprain of unspecified ligament of unspecified ankle, initial encounter: Secondary | ICD-10-CM | POA: Insufficient documentation

## 2012-01-05 DIAGNOSIS — Y92009 Unspecified place in unspecified non-institutional (private) residence as the place of occurrence of the external cause: Secondary | ICD-10-CM | POA: Insufficient documentation

## 2012-01-05 DIAGNOSIS — Z79899 Other long term (current) drug therapy: Secondary | ICD-10-CM | POA: Insufficient documentation

## 2012-01-05 DIAGNOSIS — X500XXA Overexertion from strenuous movement or load, initial encounter: Secondary | ICD-10-CM | POA: Insufficient documentation

## 2012-01-05 MED ORDER — OXYCODONE-ACETAMINOPHEN 5-325 MG PO TABS
2.0000 | ORAL_TABLET | Freq: Once | ORAL | Status: AC
  Administered 2012-01-05: 2 via ORAL
  Filled 2012-01-05: qty 2

## 2012-01-05 MED ORDER — KETOROLAC TROMETHAMINE 60 MG/2ML IM SOLN
60.0000 mg | Freq: Once | INTRAMUSCULAR | Status: AC
  Administered 2012-01-05: 60 mg via INTRAMUSCULAR
  Filled 2012-01-05: qty 2

## 2012-01-05 MED ORDER — HYDROCODONE-ACETAMINOPHEN 5-325 MG PO TABS: 2.0000 | ORAL_TABLET | ORAL | Status: AC | PRN

## 2012-01-05 NOTE — ED Provider Notes (Signed)
Medical screening examination/treatment/procedure(s) were performed by non-physician practitioner and as supervising physician I was immediately available for consultation/collaboration.   Hurman Horn, MD 01/05/12 2149

## 2012-01-05 NOTE — ED Notes (Signed)
Patient returned from xray.

## 2012-01-05 NOTE — ED Notes (Signed)
Patient transported to X-ray 

## 2012-01-05 NOTE — ED Provider Notes (Signed)
History     CSN: [id number]  Arrival date & time 01/05/12  7829   First MD Initiated Contact with Patient 01/05/12 [phone]      Chief Complaint  Patient presents with  . Ankle Pain    (Consider location/radiation/quality/duration/timing/severity/associated sxs/prior treatment) The history is provided by the patient and medical records.   Madeline Singh is a 36 y.o. female presents to the emergency department complaining of R ankle pain.  The onset of the symptoms was  abrupt starting 12 hours ago.  The patient has associated foot pain and inability to weight bear.  The symptoms have been  persistent, stabilized.  Movement, palpation makes the symptoms worse and nothing makes symptoms better.  The patient denies fever, chills, nausea, vomiting, diarrhea, loss of consciousness, neck or back pain.  She states she stumbled in the driveway last night in the dark and twisted her ankle.  She was able to limp into the house with assistance last night, but was awake with pain most of the night 2/2 pain.  She attempted treatment with an ice pack without relief.  She denies hitting her head when she fell.    This morning she was unable to weight-bear secondary to pain.  The patient has medical history significant for:  Past Medical History  Diagnosis Date  . Chronic headaches   . Bursitis      Past Medical History  Diagnosis Date  . Chronic headaches   . Bursitis     Past Surgical History  Procedure Date  . Back surgery   . Cholecystectomy   . Laproscopic & debriding of right shoulder     Family History  Problem Relation Age of Onset  . Colon cancer Mother 104    Stage 4  . Colon polyps Sister     Pre-Cancer Polyp   . Colon cancer Paternal Uncle     History  Substance Use Topics  . Smoking status: Current Everyday Smoker -- 0.5 packs/day  . Smokeless tobacco: Never Used   Comment: Counseling sheet given in exam room to quit smoking   . Alcohol Use: No    OB History    Grav  Para Term Preterm Abortions TAB SAB Ect Mult Living                  Review of Systems  HENT: Negative for neck pain.   Musculoskeletal: Positive for joint swelling and gait problem. Negative for back pain.  Skin: Positive for color change and wound (abrasions).  Neurological: Negative for numbness.    Allergies  Review of patient's allergies indicates no known allergies.  Home Medications   Current Outpatient Rx  Name Route Sig Dispense Refill  . ACETAMINOPHEN 500 MG PO TABS Oral Take 1,000 mg by mouth every 6 (six) hours as needed. pain    . GABAPENTIN 600 MG PO TABS Oral Take 1,200 mg by mouth every 8 (eight) hours.     . IBUPROFEN 200 MG PO TABS Oral Take 800 mg by mouth every 6 (six) hours as needed. For pain    . HYDROCODONE-ACETAMINOPHEN 5-325 MG PO TABS Oral Take 2 tablets by mouth every 4 (four) hours as needed for pain. 15 tablet 0    BP 117/71  Pulse 77  Temp 98.4 F (36.9 C) (Oral)  Resp 20  Ht 5\' 3"  (1.6 m)  Wt 117 lb (53.071 kg)  BMI 20.73 kg/m2  SpO2 100%  LMP 12/16/2011  Physical Exam  Nursing note and vitals  reviewed. Constitutional: She appears well-developed and well-nourished. No distress.  HENT:  Head: Normocephalic and atraumatic.  Eyes: Conjunctivae are normal.  Neck: Normal range of motion.       FROM without pain  Cardiovascular: Normal rate, regular rhythm, normal heart sounds and intact distal pulses.  Exam reveals no gallop and no friction rub.   No murmur heard.      Capillary refill <3 sec  Pulmonary/Chest: Effort normal and breath sounds normal. No respiratory distress. She has no wheezes. She exhibits no tenderness.  Musculoskeletal: She exhibits edema and tenderness.       ROM: decreased in the ankle 2/2 pain FROM of the knee without pain  Neurological: She is alert. Coordination normal.       Sensation to light touch intact Strength decreased 2/2 pain.    Skin: Skin is warm and dry. No rash noted. She is not diaphoretic.        Ecchymosis on the medial aspect of the ankle, as well as the lateral aspect extending onto the anterior portion of the foot 2 small abrasions to the lateral aspect of the ankle - no erythema, streaking or indication for infection    ED Course  Procedures (including critical care time)  Labs Reviewed - No data to display Dg Ankle Complete Right  01/05/2012  *RADIOLOGY REPORT*  Clinical Data: Swelling, pain.  Fall.  RIGHT ANKLE - COMPLETE 3+ VIEW  Comparison: None.  Findings: No acute bony abnormality.  Specifically, no fracture, subluxation, or dislocation.  Soft tissues are intact.  Mild lateral soft tissue swelling.  IMPRESSION: No acute bony abnormality.   Original Report Authenticated By: Cyndie Chime, M.D.    Dg Foot Complete Right  01/05/2012  *RADIOLOGY REPORT*  Clinical Data: Pain and swelling post fall  RIGHT FOOT COMPLETE - 3+ VIEW  Comparison: None  Findings: Soft tissue swelling and ankle. Osseous mineralization normal. Joint spaces preserved. Tiny plantar calcaneal spur. No acute fracture, dislocation or bone destruction.  IMPRESSION: No acute osseous abnormalities.   Original Report Authenticated By: Lollie Marrow, M.D.     Dg Foot Complete Right  01/05/2012  *RADIOLOGY REPORT*  Clinical Data: Pain and swelling post fall  RIGHT FOOT COMPLETE - 3+ VIEW  Comparison: None  Findings: Soft tissue swelling and ankle. Osseous mineralization normal. Joint spaces preserved. Tiny plantar calcaneal spur. No acute fracture, dislocation or bone destruction.  IMPRESSION: No acute osseous abnormalities.   Original Report Authenticated By: Lollie Marrow, M.D.     1. Ankle sprain       MDM  Madeline Singh presents with ankle pain post fall.  Concern for fracture vs ankle sprain.  Awaiting results of the x-rays.  No acute bony abnormality seen on the foot or ankle x-ray.  Patient's pain is not resolved but is improved.  She is able to manage some movement after improvement pain.  ASO applied and  crutches given.  Patient has an orthopedist that she normally sees and will followup with him. I have also discussed reasons to return immediately to the ER.  Patient states understanding.    1. Medications: Norco 2. Treatment: Rest, ice, compression, elevation , take medications as prescribed, use ASO and crutches as needed 3. Follow Up: With orthopedics        Dierdre Forth, PA-C 01/05/12 1001

## 2012-01-05 NOTE — Progress Notes (Signed)
Orthopedic Tech Progress Note Patient Details:  Madeline Singh 21-Jan-1976 [id number]  Ortho Devices Type of Ortho Device: Crutches;ASO Ortho Device/Splint Interventions: Application   Cammer, Mickie Bail 01/05/2012, 9:54 AM

## 2012-01-05 NOTE — ED Notes (Signed)
Patient states she tripped and fell in the dark at her grandmother's house last night and turned her ankle

## 2012-01-13 NOTE — ED Provider Notes (Signed)
Medical screening examination/treatment/procedure(s) were performed by non-physician practitioner and as supervising physician I was immediately available for consultation/collaboration.  Yannis Gumbs M Atwell Mcdanel, MD 01/13/12 1747 

## 2012-08-14 ENCOUNTER — Emergency Department (HOSPITAL_COMMUNITY): Payer: Medicaid Other

## 2012-08-14 ENCOUNTER — Emergency Department (HOSPITAL_COMMUNITY)
Admission: EM | Admit: 2012-08-14 | Discharge: 2012-08-14 | Disposition: A | Discharge status: Home / Self Care | Payer: Medicaid Other | Attending: Emergency Medicine | Admitting: Emergency Medicine

## 2012-08-14 ENCOUNTER — Encounter (HOSPITAL_COMMUNITY): Payer: Self-pay | Admitting: Emergency Medicine

## 2012-08-14 DIAGNOSIS — Z79899 Other long term (current) drug therapy: Secondary | ICD-10-CM | POA: Insufficient documentation

## 2012-08-14 DIAGNOSIS — Z8739 Personal history of other diseases of the musculoskeletal system and connective tissue: Secondary | ICD-10-CM | POA: Insufficient documentation

## 2012-08-14 DIAGNOSIS — M5430 Sciatica, unspecified side: Secondary | ICD-10-CM | POA: Insufficient documentation

## 2012-08-14 DIAGNOSIS — R110 Nausea: Secondary | ICD-10-CM | POA: Insufficient documentation

## 2012-08-14 DIAGNOSIS — R51 Headache: Secondary | ICD-10-CM | POA: Insufficient documentation

## 2012-08-14 DIAGNOSIS — R109 Unspecified abdominal pain: Secondary | ICD-10-CM | POA: Insufficient documentation

## 2012-08-14 DIAGNOSIS — Z3202 Encounter for pregnancy test, result negative: Secondary | ICD-10-CM | POA: Insufficient documentation

## 2012-08-14 DIAGNOSIS — M5432 Sciatica, left side: Secondary | ICD-10-CM

## 2012-08-14 DIAGNOSIS — F17200 Nicotine dependence, unspecified, uncomplicated: Secondary | ICD-10-CM | POA: Insufficient documentation

## 2012-08-14 DIAGNOSIS — D72829 Elevated white blood cell count, unspecified: Secondary | ICD-10-CM | POA: Insufficient documentation

## 2012-08-14 LAB — CBC
Hemoglobin: 11.7 g/dL — ABNORMAL LOW (ref 12.0–15.0)
MCH: 30.7 pg (ref 26.0–34.0)
MCV: 91.6 fL (ref 78.0–100.0)
Platelets: 331 10*3/uL (ref 150–400)
RBC: 3.81 MIL/uL — ABNORMAL LOW (ref 3.87–5.11)

## 2012-08-14 LAB — URINALYSIS, ROUTINE W REFLEX MICROSCOPIC
Bilirubin Urine: NEGATIVE
Ketones, ur: NEGATIVE mg/dL
Nitrite: NEGATIVE
Specific Gravity, Urine: 1.035 — ABNORMAL HIGH (ref 1.005–1.030)
pH: 5.5 (ref 5.0–8.0)

## 2012-08-14 LAB — BASIC METABOLIC PANEL
CO2: 24 mEq/L (ref 19–32)
Calcium: 9 mg/dL (ref 8.4–10.5)
Creatinine, Ser: 0.67 mg/dL (ref 0.50–1.10)
Glucose, Bld: 90 mg/dL (ref 70–99)

## 2012-08-14 MED ORDER — ONDANSETRON HCL 4 MG/2ML IJ SOLN
4.0000 mg | Freq: Once | INTRAMUSCULAR | Status: AC
  Administered 2012-08-14: 4 mg via INTRAVENOUS
  Filled 2012-08-14: qty 2

## 2012-08-14 MED ORDER — DIPHENHYDRAMINE HCL 50 MG/ML IJ SOLN
25.0000 mg | Freq: Once | INTRAMUSCULAR | Status: AC
  Administered 2012-08-14: 25 mg via INTRAVENOUS
  Filled 2012-08-14: qty 1

## 2012-08-14 MED ORDER — NAPROXEN 500 MG PO TABS: 500.0000 mg | ORAL_TABLET | Freq: Two times a day (BID) | ORAL | Status: DC

## 2012-08-14 MED ORDER — HYDROMORPHONE HCL PF 1 MG/ML IJ SOLN
0.5000 mg | Freq: Once | INTRAMUSCULAR | Status: AC
  Administered 2012-08-14: 0.5 mg via INTRAVENOUS
  Filled 2012-08-14: qty 1

## 2012-08-14 MED ORDER — DIAZEPAM 5 MG PO TABS: 5.0000 mg | ORAL_TABLET | Freq: Four times a day (QID) | ORAL | Status: DC | PRN

## 2012-08-14 MED ORDER — HYDROMORPHONE HCL PF 1 MG/ML IJ SOLN
1.0000 mg | Freq: Once | INTRAMUSCULAR | Status: AC
  Administered 2012-08-14: 1 mg via INTRAVENOUS
  Filled 2012-08-14: qty 1

## 2012-08-14 NOTE — ED Provider Notes (Signed)
History     CSN: [id number]  Arrival date & time 08/14/12  1229   First MD Initiated Contact with Patient 08/14/12 1303      Chief Complaint  Patient presents with  . Flank Pain    (Consider location/radiation/quality/duration/timing/severity/associated sxs/prior treatment) HPI Comments: Madeline Singh is a 37 y.o. female w hx of kidney stones and chronic back pain presents with Left back pain chronic with gradual worsening for months (Sciatica and lumbar surgery followed by Dr. Ophelia Charter- MRI w contrast performed yesterday). Left back pain started severely worsening last night. Symptoms unrelieved BC powder and ibuprofen. Pain radiates down left leg. No known exacerbating or alleviating factors (minimal relief w heating pads). Other associated symptoms include nausea & gaseous stomach described as "gurgling."  LNBM yesterday. Menstruation onset this morning, early by a couple of days with normal flow. Pt denies hematuia, dysuria, emesis, loss control of bowel or bladder, weakness, numbness, fevers, wt loss.   Patient is a 37 y.o. female presenting with flank pain.  Flank Pain Associated symptoms include nausea. Pertinent negatives include no abdominal pain, chest pain, chills, congestion, fever, headaches, numbness, vomiting or weakness.    Past Medical History  Diagnosis Date  . Chronic headaches   . Bursitis     Past Surgical History  Procedure Laterality Date  . Back surgery    . Cholecystectomy    . Laproscopic & debriding of right shoulder      Family History  Problem Relation Age of Onset  . Colon cancer Mother 67    Stage 4  . Colon polyps Sister     Pre-Cancer Polyp   . Colon cancer Paternal Uncle     History  Substance Use Topics  . Smoking status: Current Every Day Smoker -- 0.50 packs/day  . Smokeless tobacco: Never Used     Comment: Counseling sheet given in exam room to quit smoking   . Alcohol Use: No    OB History   Grav Para Term Preterm Abortions TAB  SAB Ect Mult Living                  Review of Systems  Constitutional: Negative for fever, chills and appetite change.  HENT: Negative for congestion.   Eyes: Negative for visual disturbance.  Respiratory: Negative for shortness of breath.   Cardiovascular: Negative for chest pain and leg swelling.  Gastrointestinal: Positive for nausea. Negative for vomiting, abdominal pain and constipation.  Genitourinary: Positive for flank pain. Negative for dysuria, urgency and frequency.  Neurological: Negative for dizziness, syncope, weakness, light-headedness, numbness and headaches.  Psychiatric/Behavioral: Negative for confusion.  All other systems reviewed and are negative.    Allergies  Oxycodone-acetaminophen  Home Medications   Current Outpatient Rx  Name  Route  Sig  Dispense  Refill  . acetaminophen (TYLENOL) 500 MG tablet   Oral   Take 1,000 mg by mouth every 6 (six) hours as needed. pain         . FLUoxetine (PROZAC) 10 MG tablet   Oral   Take 40 mg by mouth daily.         Marland Kitchen gabapentin (NEURONTIN) 600 MG tablet   Oral   Take 1,200 mg by mouth every 8 (eight) hours.          Marland Kitchen ibuprofen (ADVIL) 200 MG tablet   Oral   Take 800 mg by mouth every 6 (six) hours as needed. For pain  BP 106/63  Pulse 65  Temp(Src) 97.7 F (36.5 C) (Oral)  Resp 22  SpO2 99%  LMP 08/14/2012  Physical Exam  Nursing note and vitals reviewed. Constitutional: She is oriented to person, place, and time. She appears well-developed and well-nourished. No distress.  HENT:  Head: Normocephalic and atraumatic.  Mouth/Throat: Oropharynx is clear and moist. No oropharyngeal exudate.  Eyes: Conjunctivae and EOM are normal. Pupils are equal, round, and reactive to light. No scleral icterus.  Neck: Normal range of motion. Neck supple. No tracheal deviation present. No thyromegaly present.  Cardiovascular: Normal rate, regular rhythm, normal heart sounds and intact distal pulses.    Pulmonary/Chest: Effort normal and breath sounds normal. No stridor. No respiratory distress. She has no wheezes.  Abdominal: Soft.  Soft non tender abdomen.   Genitourinary:  No CVA tenderness. Pelvic exam deferred (apt w OBGYN tmw)  Musculoskeletal: Normal range of motion. She exhibits no edema and no tenderness.  Normal ROM. Mild ttp alon left SI and lumbar (pt states chronic)  Neurological: She is alert and oriented to person, place, and time. Coordination normal.  Intact sensation. Strength 5/5 bilaterally. Normal gait   Skin: Skin is warm and dry. No rash noted. She is not diaphoretic. No erythema. No pallor.  Psychiatric: She has a normal mood and affect. Her behavior is normal.    ED Course  Procedures (including critical care time)  Labs Reviewed  URINALYSIS, ROUTINE W REFLEX MICROSCOPIC - Abnormal; Notable for the following:    Specific Gravity, Urine 1.035 (*)    Hgb urine dipstick LARGE (*)    All other components within normal limits  CBC - Abnormal; Notable for the following:    WBC 12.0 (*)    RBC 3.81 (*)    Hemoglobin 11.7 (*)    HCT 34.9 (*)    All other components within normal limits  BASIC METABOLIC PANEL  URINE MICROSCOPIC-ADD ON  POCT PREGNANCY, URINE   Ct Abdomen Pelvis Wo Contrast  08/14/2012  *RADIOLOGY REPORT*  Clinical Data: Left flank pain radiating to left groin.  Nausea.  CT ABDOMEN AND PELVIS WITHOUT CONTRAST  Technique:  Multidetector CT imaging of the abdomen and pelvis was performed following the standard protocol without intravenous contrast.  Comparison: 01/13/2011  Findings: No evidence of renal calculi or hydronephrosis.  No evidence of ureteral calculi or dilatation.  No bladder calculi identified.  Surgical clips again seen from prior cholecystectomy. Essure micro- inserts are again seen in both fallopian tubes.  Uterus and adnexal regions are otherwise unremarkable in appearance.  The other abdominal parenchymal organs also have a normal  appearance on this noncontrast study.  No soft tissue masses or lymphadenopathy are identified.  No evidence of inflammatory process or abnormal fluid collections. Normal appendix is visualized.  No evidence of bowel wall thickening, dilatation, or hernia.  IMPRESSION: Stable exam.  No evidence of urolithiasis, hydronephrosis, or other acute findings.   Original Report Authenticated By: Myles Rosenthal, M.D.      No diagnosis found.    MDM  37 year old female presents emergency department complaining of left lower back pain radiating down leg.  Patient has history of chronic sciatica followed by Dr. Ophelia Charter with MRI performed yesterday.  Patient did not have any pain medications at home.  In addition patient onset of menstruation began today and associated symptoms of vaginal bleeding and abdominal cramping is likely due to menstrual cycle.  Pelvic exam deferred as patient has followup party scheduled with OB/GYN for tomorrow.  Pain managed in the emergency department. Labs reviewed, mild leukoytosis but with normal BP 106/63  Pulse 65  Temp(Src) 97.7 F (36.5 C) (Oral)  Resp 22  SpO2 99%  LMP 08/14/2012  And pt does not appear toxic.  At this time there does not appear to be any evidence of an acute emergency medical condition and the patient appears stable for discharge with appropriate outpatient follow up.Diagnosis was discussed with patient who verbalizes understanding and is agreeable to discharge.       Jaci Carrel, PA-C 08/14/12 1500

## 2012-08-14 NOTE — ED Provider Notes (Signed)
Medical screening examination/treatment/procedure(s) were performed by non-physician practitioner and as supervising physician I was immediately available for consultation/collaboration.  Gilda Crease, MD 08/14/12 [phone]

## 2012-08-14 NOTE — ED Notes (Signed)
Pt c/o of flank pain that radiates down into leg. Also of nausea, no vomiting. Unable to sleep. States that she had a MRI yesterday. NAD at this time.

## 2012-10-30 ENCOUNTER — Emergency Department (HOSPITAL_COMMUNITY): Payer: Medicaid Other

## 2012-10-30 ENCOUNTER — Emergency Department (HOSPITAL_COMMUNITY)
Admission: EM | Admit: 2012-10-30 | Discharge: 2012-10-30 | Disposition: A | Discharge status: Home / Self Care | Payer: Medicaid Other | Attending: Emergency Medicine | Admitting: Emergency Medicine

## 2012-10-30 DIAGNOSIS — Z8669 Personal history of other diseases of the nervous system and sense organs: Secondary | ICD-10-CM | POA: Insufficient documentation

## 2012-10-30 DIAGNOSIS — Z79899 Other long term (current) drug therapy: Secondary | ICD-10-CM | POA: Insufficient documentation

## 2012-10-30 DIAGNOSIS — F17200 Nicotine dependence, unspecified, uncomplicated: Secondary | ICD-10-CM | POA: Insufficient documentation

## 2012-10-30 DIAGNOSIS — Z791 Long term (current) use of non-steroidal anti-inflammatories (NSAID): Secondary | ICD-10-CM | POA: Insufficient documentation

## 2012-10-30 DIAGNOSIS — Z8739 Personal history of other diseases of the musculoskeletal system and connective tissue: Secondary | ICD-10-CM | POA: Insufficient documentation

## 2012-10-30 DIAGNOSIS — Y9389 Activity, other specified: Secondary | ICD-10-CM | POA: Insufficient documentation

## 2012-10-30 DIAGNOSIS — T148XXA Other injury of unspecified body region, initial encounter: Secondary | ICD-10-CM

## 2012-10-30 DIAGNOSIS — Y9289 Other specified places as the place of occurrence of the external cause: Secondary | ICD-10-CM | POA: Insufficient documentation

## 2012-10-30 DIAGNOSIS — S9030XA Contusion of unspecified foot, initial encounter: Secondary | ICD-10-CM | POA: Insufficient documentation

## 2012-10-30 MED ORDER — HYDROCODONE-ACETAMINOPHEN 5-325 MG PO TABS: 2.0000 | ORAL_TABLET | ORAL | Status: DC | PRN

## 2012-10-30 MED ORDER — ONDANSETRON HCL 4 MG/2ML IJ SOLN: 4.0000 mg | Freq: Once | INTRAMUSCULAR | Status: DC

## 2012-10-30 MED ORDER — NAPROXEN 500 MG PO TABS: 500.0000 mg | ORAL_TABLET | Freq: Two times a day (BID) | ORAL | Status: DC

## 2012-10-30 MED ORDER — HYDROCODONE-ACETAMINOPHEN 5-325 MG PO TABS
2.0000 | ORAL_TABLET | Freq: Once | ORAL | Status: AC
  Administered 2012-10-30: 2 via ORAL
  Filled 2012-10-30: qty 2

## 2012-10-30 NOTE — ED Notes (Signed)
Patient transported to X-ray 

## 2012-10-30 NOTE — ED Provider Notes (Signed)
History    This chart was scribed for non-physician practitioner, Arthor Captain, PA-C, working with Vida Roller, MD by Donne Anon, ED Scribe. This patient was seen in room WTR6/WTR6 and the patient's care was started at 1917.   CSN: [id number]  Arrival date & time 10/30/12  1821   First MD Initiated Contact with Patient 10/30/12 1917      Chief Complaint  Patient presents with  . Foot Injury    The history is provided by the patient. No language interpreter was used.   HPI Comments: Madeline Singh is a 37 y.o. female who presents to the Emergency Department complaining of sudden onset, gradually worsening, constant, severe right foot pain that began 1 hour PTA when a [employer] 300 accidentally ran over her foot and she heard something pop. She is ambulatory but walking makes the pain worse. She denies fever or any other pain.     Past Medical History  Diagnosis Date  . Chronic headaches   . Bursitis     Past Surgical History  Procedure Laterality Date  . Back surgery    . Cholecystectomy    . Laproscopic & debriding of right shoulder      Family History  Problem Relation Age of Onset  . Colon cancer Mother 42    Stage 4  . Colon polyps Sister     Pre-Cancer Polyp   . Colon cancer Paternal Uncle     History  Substance Use Topics  . Smoking status: Current Every Day Smoker -- 0.50 packs/day  . Smokeless tobacco: Never Used     Comment: Counseling sheet given in exam room to quit smoking   . Alcohol Use: No     Review of Systems  Musculoskeletal: Positive for arthralgias.  All other systems reviewed and are negative.    Allergies  Oxycodone-acetaminophen  Home Medications   Current Outpatient Rx  Name  Route  Sig  Dispense  Refill  . acetaminophen (TYLENOL) 500 MG tablet   Oral   Take 1,000 mg by mouth every 6 (six) hours as needed. pain         . diazepam (VALIUM) 5 MG tablet   Oral   Take 1 tablet (5 mg total) by mouth every 6 (six) hours as  needed for anxiety.   15 tablet   0   . FLUoxetine (PROZAC) 10 MG tablet   Oral   Take 40 mg by mouth daily.         Marland Kitchen gabapentin (NEURONTIN) 600 MG tablet   Oral   Take 1,200 mg by mouth every 8 (eight) hours.          Marland Kitchen ibuprofen (ADVIL) 200 MG tablet   Oral   Take 800 mg by mouth every 6 (six) hours as needed. For pain         . naproxen (NAPROSYN) 500 MG tablet   Oral   Take 1 tablet (500 mg total) by mouth 2 (two) times daily.   30 tablet   0     BP 115/76  Pulse 75  Temp(Src) 98 F (36.7 C) (Oral)  Resp 17  SpO2 100%  LMP 10/14/2012  Physical Exam  Nursing note and vitals reviewed. Constitutional: She appears well-developed and well-nourished. No distress.  HENT:  Head: Normocephalic and atraumatic.  Eyes: Conjunctivae are normal.  Neck: Neck supple. No tracheal deviation present.  Cardiovascular: Normal rate.   Pulmonary/Chest: Effort normal. No respiratory distress.  Musculoskeletal: Normal range of motion.  Ecchymosis and swelling over the distal portion of the metatarsals of the right foot. Tire marks on the right foot. Peripheral pulses intact. Able to wiggle toes. Sensation intact. Compartments are soft.   Neurological: She is alert.  Skin: Skin is warm and dry.  Psychiatric: She has a normal mood and affect. Her behavior is normal.    ED Course  Procedures (including critical care time) DIAGNOSTIC STUDIES: Oxygen Saturation is 100% on RA, normal by my interpretation.    COORDINATION OF CARE: 7:40 PM Discussed treatment plan which includes pain medication with pt at bedside and pt agreed to plan. Will recheck pt.    Labs Reviewed - No data to display Dg Foot Complete Right  10/30/2012   *RADIOLOGY REPORT*  Clinical Data: Trauma.  Right foot pain.  RIGHT FOOT COMPLETE - 3+ VIEW  Comparison: 01/05/2012  Findings: No acute bony abnormality.  Specifically, no fracture, subluxation, or dislocation.  Soft tissues are intact. Joint spaces are  maintained.  Normal bone mineralization.  IMPRESSION: Negative.   Original Report Authenticated By: Charlett Nose, M.D.     1. Hematoma       MDM  Xray negative. Discharge with post op shoe, crutches, and vicodin.  I personally performed the services described in this documentation, which was scribed in my presence. The recorded information has been reviewed and is accurate.       Arthor Captain, PA-C 10/30/12 2003

## 2012-10-30 NOTE — ED Notes (Signed)
Pt states she has crutches at home that she can use.

## 2012-10-30 NOTE — ED Notes (Signed)
Pt states her R foot was ran over by a [employer] 300 about an hour PTA. Pt states she was bare foot at time of injury. Pt ambulatory to exam room with limp. Pt arrives with family members.

## 2012-10-30 NOTE — ED Notes (Signed)
Pt's foot elevated in gurney. Ice pack provided.

## 2012-10-30 NOTE — ED Provider Notes (Signed)
Medical screening examination/treatment/procedure(s) were performed by non-physician practitioner and as supervising physician I was immediately available for consultation/collaboration.    Miel Wisener D Kunio Cummiskey, MD 10/30/12 2342 

## 2013-08-23 ENCOUNTER — Encounter (HOSPITAL_COMMUNITY): Payer: Self-pay | Admitting: Emergency Medicine

## 2013-08-23 ENCOUNTER — Emergency Department (HOSPITAL_COMMUNITY)
Admission: EM | Admit: 2013-08-23 | Discharge: 2013-08-23 | Disposition: A | Discharge status: Home / Self Care | Payer: Medicaid Other | Attending: Emergency Medicine | Admitting: Emergency Medicine

## 2013-08-23 DIAGNOSIS — Z79899 Other long term (current) drug therapy: Secondary | ICD-10-CM | POA: Insufficient documentation

## 2013-08-23 DIAGNOSIS — IMO0002 Reserved for concepts with insufficient information to code with codable children: Secondary | ICD-10-CM | POA: Insufficient documentation

## 2013-08-23 DIAGNOSIS — F17200 Nicotine dependence, unspecified, uncomplicated: Secondary | ICD-10-CM | POA: Insufficient documentation

## 2013-08-23 DIAGNOSIS — M549 Dorsalgia, unspecified: Secondary | ICD-10-CM

## 2013-08-23 DIAGNOSIS — G8929 Other chronic pain: Secondary | ICD-10-CM | POA: Insufficient documentation

## 2013-08-23 DIAGNOSIS — Z9889 Other specified postprocedural states: Secondary | ICD-10-CM | POA: Insufficient documentation

## 2013-08-23 DIAGNOSIS — M5416 Radiculopathy, lumbar region: Secondary | ICD-10-CM

## 2013-08-23 MED ORDER — METHOCARBAMOL 500 MG PO TABS: 500.0000 mg | ORAL_TABLET | Freq: Three times a day (TID) | ORAL | Status: DC | PRN

## 2013-08-23 NOTE — ED Notes (Signed)
PA at the bedside.

## 2013-08-23 NOTE — ED Provider Notes (Signed)
CSN: [id number]     Arrival date & time 08/23/13  1049 History  This chart was scribed for non-physician practitioner, Clayton Bibles, PA-C,working with Janice Norrie, MD, by Marlowe Kays, ED Scribe.  This patient was seen in room TR07C/TR07C and the patient's care was started at 12:46 PM.  Chief Complaint  Patient presents with  . Back Pain   The history is provided by the patient. No language interpreter was used.   HPI Comments:  Madeline Singh is a 38 y.o. female with h/o chronic back pain who presents to the Emergency Department complaining of worsening lower back pain that started approximately 2-3 days ago. She reports she has had surgery on her back about 5 years ago by Dr. Lorin Mercy. She reports h/o sciatica and issues with her L5 area. She states it feels as if there is an "elephant sitting on my hips". She states the left side is worse and radiates all the way down her leg to her foot. She states she does a lot of bending and heavy lifting. She states she has an exacerbation every 5-6 months. She states she takes Gabapentin on a regular basis and has been taking Ibuprofen. Pt is ambulatory without issue. She denies any recent trauma. She denies any numbness or weakness, bowel or bladder incontinence, difficulty urinating, vaginal discharge or bleeding, abdominal pain, or fever. She denies any h/o cancer. She denies IV drug use. She states her PCP is Dr. Arelia Sneddon in [place].   Past Medical History  Diagnosis Date  . Chronic headaches   . Bursitis    Past Surgical History  Procedure Laterality Date  . Back surgery    . Cholecystectomy    . Laproscopic & debriding of right shoulder     Family History  Problem Relation Age of Onset  . Colon cancer Mother 12    Stage 4  . Colon polyps Sister     Pre-Cancer Polyp   . Colon cancer Paternal Uncle    History  Substance Use Topics  . Smoking status: Current Every Day Smoker -- 0.50 packs/day  . Smokeless tobacco: Never Used      Comment: Counseling sheet given in exam room to quit smoking   . Alcohol Use: No   OB History   Grav Para Term Preterm Abortions TAB SAB Ect Mult Living                 Review of Systems  Constitutional: Negative for fever.  Gastrointestinal: Negative for abdominal pain.  Genitourinary: Negative for vaginal bleeding, vaginal discharge and difficulty urinating.  Musculoskeletal: Positive for back pain. Negative for gait problem.  Neurological: Negative for weakness and numbness.  All other systems reviewed and are negative.   Allergies  Review of patient's allergies indicates no active allergies.  Home Medications   Current Outpatient Rx  Name  Route  Sig  Dispense  Refill  . gabapentin (NEURONTIN) 300 MG capsule   Oral   Take 600 mg by mouth 2 (two) times daily.         Marland Kitchen ibuprofen (ADVIL) 200 MG tablet   Oral   Take 800 mg by mouth every 6 (six) hours as needed. For pain          Triage Vitals: BP 115/78  Pulse 79  Temp(Src) 97.5 F (36.4 C) (Oral)  Resp 18  Wt 137 lb 9.6 oz (62.415 kg)  SpO2 100% Physical Exam  Nursing note and vitals reviewed. Constitutional: She appears well-developed  and well-nourished. No distress.  HENT:  Head: Normocephalic and atraumatic.  Neck: Neck supple.  Pulmonary/Chest: Effort normal.  Abdominal: Soft. She exhibits no distension and no mass. There is no tenderness. There is no rebound and no guarding.  Musculoskeletal: Normal range of motion.  Spine nontender, no crepitus, or stepoffs. Lower extremities:  Strength 5/5, sensation intact, distal pulses intact.  Normal gait.   Neurological: She is alert. She displays normal reflexes.  Skin: She is not diaphoretic.    ED Course  Procedures (including critical care time) DIAGNOSTIC STUDIES: Oxygen Saturation is 100% on RA, normal by my interpretation.   COORDINATION OF CARE: 12:52 PM- Will order medication for pain. Pt verbalizes understanding and agrees to  plan.  Medications - No data to display  Labs Review Labs Reviewed - No data to display Imaging Review No results found.   EKG Interpretation None      DEA Database, 07/14/13 Received #120 vicodin 5-325.    MDM   Final diagnoses:  Back pain  Lumbar radiculopathy, chronic    Pt with chronic low back pain with radiation into left leg, worse over the past 2 days.  Neurovascularly intact.  No red flags.  Pt has had surgery several years ago by Dr Lorin Mercy, is requesting second opinion because she does not want the surgery Dr Lorin Mercy thinks is necessary.  No need for emergent imaging or surgery today.  Received #120 vicodin in early March, pt states was for bacterial vaginosis.  Pt d/c home with robaxin, neurosurgery follow up.  Discussed findings, treatment, and follow up  with patient.  Pt given return precautions.  Pt verbalizes understanding and agrees with plan.      I personally performed the services described in this documentation, which was scribed in my presence. The recorded information has been reviewed and is accurate.    Clayton Bibles, PA-C 08/23/13 1313

## 2013-08-23 NOTE — ED Notes (Signed)
C/o increased low back pain with hx of chronic pain, no recent trauma, no other complaints, is alert, ambulatory and in NAD

## 2013-08-23 NOTE — ED Provider Notes (Signed)
Medical screening examination/treatment/procedure(s) were performed by non-physician practitioner and as supervising physician I was immediately available for consultation/collaboration.   EKG Interpretation None      Rolland Porter, MD, Abram Sander   Janice Norrie, MD 08/23/13 [phone]

## 2013-08-23 NOTE — ED Notes (Signed)
Patient has a history of nerve pain in her left leg, shrap, deep pain. Patient has dull constant pain in her left lower back.

## 2013-08-23 NOTE — Discharge Instructions (Signed)
Read the information below.  Use the prescribed medication as directed.  Please discuss all new medications with your pharmacist.  You may return to the Emergency Department at any time for worsening condition or any new symptoms that concern you.    If you develop fevers, loss of control of bowel or bladder, weakness or numbness in your legs, or are unable to walk, return to the ER for a recheck.  ° °Back Pain, Adult °Low back pain is very common. About 1 in 5 people have back pain. The cause of low back pain is rarely dangerous. The pain often gets better over time. About half of people with a sudden onset of back pain feel better in just 2 weeks. About 8 in 10 people feel better by 6 weeks.  °CAUSES °Some common causes of back pain include: °· Strain of the muscles or ligaments supporting the spine. °· Wear and tear (degeneration) of the spinal discs. °· Arthritis. °· Direct injury to the back. °DIAGNOSIS °Most of the time, the direct cause of low back pain is not known. However, back pain can be treated effectively even when the exact cause of the pain is unknown. Answering your caregiver's questions about your overall health and symptoms is one of the most accurate ways to make sure the cause of your pain is not dangerous. If your caregiver needs more information, he or she may order lab work or imaging tests (X-rays or MRIs). However, even if imaging tests show changes in your back, this usually does not require surgery. °HOME CARE INSTRUCTIONS °For many people, back pain returns. Since low back pain is rarely dangerous, it is often a condition that people can learn to manage on their own.  °· Remain active. It is stressful on the back to sit or stand in one place. Do not sit, drive, or stand in one place for more than 30 minutes at a time. Take short walks on level surfaces as soon as pain allows. Try to increase the length of time you walk each day. °· Do not stay in bed. Resting more than 1 or 2 days can  delay your recovery. °· Do not avoid exercise or work. Your body is made to move. It is not dangerous to be active, even though your back may hurt. Your back will likely heal faster if you return to being active before your pain is gone. °· Pay attention to your body when you  bend and lift. Many people have less discomfort when lifting if they bend their knees, keep the load close to their bodies, and avoid twisting. Often, the most comfortable positions are those that put less stress on your recovering back. °· Find a comfortable position to sleep. Use a firm mattress and lie on your side with your knees slightly bent. If you lie on your back, put a pillow under your knees. °· Only take over-the-counter or prescription medicines as directed by your caregiver. Over-the-counter medicines to reduce pain and inflammation are often the most helpful. Your caregiver may prescribe muscle relaxant drugs. These medicines help dull your pain so you can more quickly return to your normal activities and healthy exercise. °· Put ice on the injured area. °· Put ice in a plastic bag. °· Place a towel between your skin and the bag. °· Leave the ice on for 15-20 minutes, 03-04 times a day for the first 2 to 3 days. After that, ice and heat may be alternated to reduce pain and spasms. °· Ask your caregiver about trying back exercises and gentle massage. This may be of some   benefit.  Avoid feeling anxious or stressed.Stress increases muscle tension and can worsen back pain.It is important to recognize when you are anxious or stressed and learn ways to manage it.Exercise is a great option. SEEK MEDICAL CARE IF:  You have pain that is not relieved with rest or medicine.  You have pain that does not improve in 1 week.  You have new symptoms.  You are generally not feeling well. SEEK IMMEDIATE MEDICAL CARE IF:   You have pain that radiates from your back into your legs.  You develop new bowel or bladder control  problems.  You have unusual weakness or numbness in your arms or legs.  You develop nausea or vomiting.  You develop abdominal pain.  You feel faint. Document Released: 05/01/2005 Document Revised: 10/31/2011 Document Reviewed: 09/19/2010 [facility] Patient Information 2014 [place], [place].

## 2014-03-17 ENCOUNTER — Emergency Department (HOSPITAL_COMMUNITY)
Admission: EM | Admit: 2014-03-17 | Discharge: 2014-03-17 | Disposition: A | Discharge status: Home / Self Care | Payer: Medicaid Other | Attending: Emergency Medicine | Admitting: Emergency Medicine

## 2014-03-17 ENCOUNTER — Encounter (HOSPITAL_COMMUNITY): Payer: Self-pay | Admitting: Emergency Medicine

## 2014-03-17 DIAGNOSIS — G8929 Other chronic pain: Secondary | ICD-10-CM | POA: Diagnosis not present

## 2014-03-17 DIAGNOSIS — Z8739 Personal history of other diseases of the musculoskeletal system and connective tissue: Secondary | ICD-10-CM | POA: Insufficient documentation

## 2014-03-17 DIAGNOSIS — Z79899 Other long term (current) drug therapy: Secondary | ICD-10-CM | POA: Diagnosis not present

## 2014-03-17 DIAGNOSIS — R05 Cough: Secondary | ICD-10-CM | POA: Diagnosis not present

## 2014-03-17 DIAGNOSIS — M549 Dorsalgia, unspecified: Secondary | ICD-10-CM | POA: Diagnosis not present

## 2014-03-17 DIAGNOSIS — J029 Acute pharyngitis, unspecified: Secondary | ICD-10-CM | POA: Insufficient documentation

## 2014-03-17 DIAGNOSIS — Z720 Tobacco use: Secondary | ICD-10-CM | POA: Insufficient documentation

## 2014-03-17 LAB — RAPID STREP SCREEN (MED CTR MEBANE ONLY): Streptococcus, Group A Screen (Direct): NEGATIVE

## 2014-03-17 MED ORDER — HYDROCODONE-ACETAMINOPHEN 5-325 MG PO TABS: 1.0000 | ORAL_TABLET | Freq: Four times a day (QID) | ORAL | Status: DC | PRN

## 2014-03-17 MED ORDER — KETOROLAC TROMETHAMINE 60 MG/2ML IM SOLN
60.0000 mg | Freq: Once | INTRAMUSCULAR | Status: AC
  Administered 2014-03-17: 60 mg via INTRAMUSCULAR
  Filled 2014-03-17: qty 2

## 2014-03-17 MED ORDER — HYDROCODONE-ACETAMINOPHEN 5-325 MG PO TABS
1.0000 | ORAL_TABLET | Freq: Once | ORAL | Status: AC
  Administered 2014-03-17: 1 via ORAL
  Filled 2014-03-17: qty 1

## 2014-03-17 NOTE — ED Provider Notes (Signed)
CSN: [id number]     Arrival date & time 03/17/14  0906 History   First MD Initiated Contact with Patient 03/17/14 0920     Chief Complaint  Patient presents with  . Back Pain  . Sore Throat     (Consider location/radiation/quality/duration/timing/severity/associated sxs/prior Treatment) HPI  This a 38 year old female with history of chronic back pain who presents with back pain and sore throat. Patient reports "flareup" of chronic back pain. Reports history of back surgery. Saw her primary care physician last week and was referred to orthopedist who cannot see her until next week. Patient reports that she's been taking ibuprofen and Tylenol home with minimal relief. He reports left-sided back pain that radiates downwards. It is worse with walking.  Currently she rates her pain 8 out of 10. She denies any urinary symptoms. She denies any weakness, numbness, tingling of the legs, bowel, or bladder issues. Patient also states that she woke up this morning with a sore throat. She reports pain with swallowing. She denies any fevers. Does endorse a cough. Denies any sore throat, rhinorrhea, chest pain, shortness of breath.  Past Medical History  Diagnosis Date  . Chronic headaches   . Bursitis    Past Surgical History  Procedure Laterality Date  . Back surgery    . Cholecystectomy    . Laproscopic & debriding of right shoulder     Family History  Problem Relation Age of Onset  . Colon cancer Mother 4    Stage 4  . Colon polyps Sister     Pre-Cancer Polyp   . Colon cancer Paternal Uncle    History  Substance Use Topics  . Smoking status: Current Every Day Smoker -- 0.50 packs/day  . Smokeless tobacco: Never Used     Comment: Counseling sheet given in exam room to quit smoking   . Alcohol Use: No   OB History    No data available     Review of Systems  Constitutional: Negative for fever.  HENT: Positive for sore throat. Negative for rhinorrhea.   Respiratory: Positive for  cough. Negative for chest tightness and shortness of breath.   Cardiovascular: Negative for chest pain.  Gastrointestinal: Negative for nausea, vomiting and abdominal pain.  Genitourinary: Negative for dysuria and difficulty urinating.  Musculoskeletal: Positive for back pain. Negative for gait problem.  Skin: Negative for wound.  Neurological: Negative for weakness, numbness and headaches.  Psychiatric/Behavioral: Negative for confusion.  All other systems reviewed and are negative.     Allergies  Review of patient's allergies indicates no known allergies.  Home Medications   Prior to Admission medications   Medication Sig Start Date End Date Taking? Authorizing Provider  gabapentin (NEURONTIN) 300 MG capsule Take 600 mg by mouth 2 (two) times daily.    Historical Provider, MD  HYDROcodone-acetaminophen (NORCO/VICODIN) 5-325 MG per tablet Take 1 tablet by mouth every 6 (six) hours as needed for moderate pain. 03/17/14   Merryl Hacker, MD  ibuprofen (ADVIL) 200 MG tablet Take 800 mg by mouth every 6 (six) hours as needed. For pain    Historical Provider, MD  methocarbamol (ROBAXIN) 500 MG tablet Take 1 tablet (500 mg total) by mouth every 8 (eight) hours as needed for muscle spasms (or pain). 08/23/13   Clayton Bibles, PA-C   BP 110/73 mmHg  Pulse 76  Temp(Src) 97.5 F (36.4 C) (Oral)  Resp 16  SpO2 100% Physical Exam  Constitutional: She is oriented to person, place, and time. She  appears well-developed and well-nourished. No distress.  HENT:  Head: Normocephalic and atraumatic.  Mouth/Throat: Oropharynx is clear and moist. No oropharyngeal exudate.  Eyes: Pupils are equal, round, and reactive to light.  Cardiovascular: Normal rate, regular rhythm and normal heart sounds.   No murmur heard. Pulmonary/Chest: Effort normal and breath sounds normal. No respiratory distress. She has no wheezes.  Musculoskeletal:  Tenderness to palpation over the left paraspinous muscle region   Lymphadenopathy:    She has cervical adenopathy.  Neurological: She is alert and oriented to person, place, and time.  5 out of 5 strength in bilateral lower extremities, normal gait, normal reflexes  Skin: Skin is warm and dry.  Psychiatric: She has a normal mood and affect.  Nursing note and vitals reviewed.   ED Course  Procedures (including critical care time) Labs Review Labs Reviewed  RAPID STREP SCREEN  CULTURE, GROUP A STREP    Imaging Review No results found.   EKG Interpretation None      MDM   Final diagnoses:  Chronic back pain  Sore throat    Patient presents with back pain and sore throat. Back pain is chronic in nature. No signs or symptoms of cauda equina. Neurologically intact. Patient given Norco and Toradol in the emergency room. No evidence of tonsillar exudate and no other viral symptoms regarding patient's sore throat. Strep screen negative. Discuss with patient that she will need to follow-up with her primary doctors regarding her chronic back pain. I will give her a very short course of Norco and she is to continue anti-inflammatories at home.  After history, exam, and medical workup I feel the patient has been appropriately medically screened and is safe for discharge home. Pertinent diagnoses were discussed with the patient. Patient was given return precautions.     Merryl Hacker, MD 03/17/14 1019

## 2014-03-17 NOTE — ED Notes (Signed)
MD at bedside. 

## 2014-03-17 NOTE — Discharge Instructions (Signed)
Sore Throat A sore throat is pain, burning, irritation, or scratchiness of the throat. There is often pain or tenderness when swallowing or talking. A sore throat may be accompanied by other symptoms, such as coughing, sneezing, fever, and swollen neck glands. A sore throat is often the first sign of another sickness, such as a cold, flu, strep throat, or mononucleosis (commonly known as mono). Most sore throats go away without medical treatment. CAUSES  The most common causes of a sore throat include:  A viral infection, such as a cold, flu, or mono.  A bacterial infection, such as strep throat, tonsillitis, or whooping cough.  Seasonal allergies.  Dryness in the air.  Irritants, such as smoke or pollution.  Gastroesophageal reflux disease (GERD). HOME CARE INSTRUCTIONS   Only take over-the-counter medicines as directed by your caregiver.  Drink enough fluids to keep your urine clear or pale yellow.  Rest as needed.  Try using throat sprays, lozenges, or sucking on hard candy to ease any pain (if older than 4 years or as directed).  Sip warm liquids, such as broth, herbal tea, or warm water with honey to relieve pain temporarily. You may also eat or drink cold or frozen liquids such as frozen ice pops.  Gargle with salt water (mix 1 tsp salt with 8 oz of water).  Do not smoke and avoid secondhand smoke.  Put a cool-mist humidifier in your bedroom at night to moisten the air. You can also turn on a hot shower and sit in the bathroom with the door closed for 5-10 minutes. SEEK IMMEDIATE MEDICAL CARE IF:  You have difficulty breathing.  You are unable to swallow fluids, soft foods, or your saliva.  You have increased swelling in the throat.  Your sore throat does not get better in 7 days.  You have nausea and vomiting.  You have a fever or persistent symptoms for more than 2-3 days.  You have a fever and your symptoms suddenly get worse. MAKE SURE YOU:   Understand  these instructions.  Will watch your condition.  Will get help right away if you are not doing well or get worse. Document Released: 06/08/2004 Document Revised: 04/17/2012 Document Reviewed: 01/07/2012 [facility] Patient Information 2015 [place], [place]. This information is not intended to replace advice given to you by your health care provider. Make sure you discuss any questions you have with your health care provider. Back Pain, Adult Low back pain is very common. About 1 in 5 people have back pain.The cause of low back pain is rarely dangerous. The pain often gets better over time.About half of people with a sudden onset of back pain feel better in just 2 weeks. About 8 in 10 people feel better by 6 weeks.  CAUSES Some common causes of back pain include:  Strain of the muscles or ligaments supporting the spine.  Wear and tear (degeneration) of the spinal discs.  Arthritis.  Direct injury to the back. DIAGNOSIS Most of the time, the direct cause of low back pain is not known.However, back pain can be treated effectively even when the exact cause of the pain is unknown.Answering your caregiver's questions about your overall health and symptoms is one of the most accurate ways to make sure the cause of your pain is not dangerous. If your caregiver needs more information, he or she may order lab work or imaging tests (X-rays or MRIs).However, even if imaging tests show changes in your back, this usually does not require surgery.  HOME CARE INSTRUCTIONS For many people, back pain returns.Since low back pain is rarely dangerous, it is often a condition that people can learn to [facility] their own.   Remain active. It is stressful on the back to sit or stand in one place. Do not sit, drive, or stand in one place for more than 30 minutes at a time. Take short walks on level surfaces as soon as pain allows.Try to increase the length of time you walk each day.  Do not stay in bed.Resting  more than 1 or 2 days can delay your recovery.  Do not avoid exercise or work.Your body is made to move.It is not dangerous to be active, even though your back may hurt.Your back will likely heal faster if you return to being active before your pain is gone.  Pay attention to your body when you bend and lift. Many people have less discomfortwhen lifting if they bend their knees, keep the load close to their bodies,and avoid twisting. Often, the most comfortable positions are those that put less stress on your recovering back.  Find a comfortable position to sleep. Use a firm mattress and lie on your side with your knees slightly bent. If you lie on your back, put a pillow under your knees.  Only take over-the-counter or prescription medicines as directed by your caregiver. Over-the-counter medicines to reduce pain and inflammation are often the most helpful.Your caregiver may prescribe muscle relaxant drugs.These medicines help dull your pain so you can more quickly return to your normal activities and healthy exercise.  Put ice on the injured area.  Put ice in a plastic bag.  Place a towel between your skin and the bag.  Leave the ice on for 15-20 minutes, 03-04 times a day for the first 2 to 3 days. After that, ice and heat may be alternated to reduce pain and spasms.  Ask your caregiver about trying back exercises and gentle massage. This may be of some benefit.  Avoid feeling anxious or stressed.Stress increases muscle tension and can worsen back pain.It is important to recognize when you are anxious or stressed and learn ways to manage it.Exercise is a great option. SEEK MEDICAL CARE IF:  You have pain that is not relieved with rest or medicine.  You have pain that does not improve in 1 week.  You have new symptoms.  You are generally not feeling well. SEEK IMMEDIATE MEDICAL CARE IF:   You have pain that radiates from your back into your legs.  You develop new bowel  or bladder control problems.  You have unusual weakness or numbness in your arms or legs.  You develop nausea or vomiting.  You develop abdominal pain.  You feel faint. Document Released: 05/01/2005 Document Revised: 10/31/2011 Document Reviewed: 09/02/2013 [facility] Patient Information 2015 [place], [place]. This information is not intended to replace advice given to you by your health care provider. Make sure you discuss any questions you have with your health care provider.

## 2014-03-17 NOTE — ED Notes (Signed)
Pt c/o back pain for several years, Hx of back surgery, recent flare up of back pain started 2 weeks ago, pt was seen for back pain last week, scheduled orthopedic follow-up for next week. Pt denies any bladder or bowel incontinence. Pt also c/o sore, swollen throat onset this morning, some swelling to left tonsil area, no erythema or white patches noted by this RN.

## 2014-03-19 LAB — CULTURE, GROUP A STREP

## 2014-04-08 ENCOUNTER — Other Ambulatory Visit: Payer: Self-pay | Admitting: Orthopedic Surgery

## 2014-04-08 DIAGNOSIS — M79605 Pain in left leg: Secondary | ICD-10-CM

## 2014-04-08 DIAGNOSIS — M545 Low back pain: Secondary | ICD-10-CM

## 2014-04-15 ENCOUNTER — Other Ambulatory Visit: Payer: Self-pay | Admitting: Orthopedic Surgery

## 2014-04-15 ENCOUNTER — Ambulatory Visit
Admission: RE | Admit: 2014-04-15 | Discharge: 2014-04-15 | Disposition: A | Discharge status: Home / Self Care | Payer: Medicaid Other | Source: Ambulatory Visit | Attending: Orthopedic Surgery | Admitting: Orthopedic Surgery

## 2014-04-15 DIAGNOSIS — M545 Low back pain: Secondary | ICD-10-CM

## 2014-04-15 DIAGNOSIS — M79605 Pain in left leg: Secondary | ICD-10-CM

## 2014-04-15 MED ORDER — IOHEXOL 180 MG/ML  SOLN
1.0000 mL | Freq: Once | INTRAMUSCULAR | Status: AC | PRN
  Administered 2014-04-15: 1 mL via EPIDURAL

## 2014-04-15 MED ORDER — METHYLPREDNISOLONE ACETATE 40 MG/ML INJ SUSP (RADIOLOG
120.0000 mg | Freq: Once | INTRAMUSCULAR | Status: AC
  Administered 2014-04-15: 120 mg via EPIDURAL

## 2014-04-15 NOTE — Discharge Instructions (Signed)

## 2014-05-13 ENCOUNTER — Emergency Department (HOSPITAL_COMMUNITY)
Admission: EM | Admit: 2014-05-13 | Discharge: 2014-05-13 | Disposition: A | Discharge status: Home / Self Care | Payer: Medicaid Other | Attending: Emergency Medicine | Admitting: Emergency Medicine

## 2014-05-13 ENCOUNTER — Encounter (HOSPITAL_COMMUNITY): Payer: Self-pay | Admitting: Emergency Medicine

## 2014-05-13 DIAGNOSIS — Z79899 Other long term (current) drug therapy: Secondary | ICD-10-CM | POA: Diagnosis not present

## 2014-05-13 DIAGNOSIS — Z720 Tobacco use: Secondary | ICD-10-CM | POA: Diagnosis not present

## 2014-05-13 DIAGNOSIS — R110 Nausea: Secondary | ICD-10-CM | POA: Diagnosis not present

## 2014-05-13 DIAGNOSIS — M5442 Lumbago with sciatica, left side: Secondary | ICD-10-CM

## 2014-05-13 DIAGNOSIS — Z9889 Other specified postprocedural states: Secondary | ICD-10-CM | POA: Diagnosis not present

## 2014-05-13 DIAGNOSIS — M5430 Sciatica, unspecified side: Secondary | ICD-10-CM | POA: Diagnosis present

## 2014-05-13 HISTORY — DX: Sciatica, unspecified side: M54.30

## 2014-05-13 MED ORDER — OXYCODONE-ACETAMINOPHEN 5-325 MG PO TABS: 1.0000 | ORAL_TABLET | ORAL | Status: DC | PRN

## 2014-05-13 MED ORDER — PREDNISONE 20 MG PO TABS
60.0000 mg | ORAL_TABLET | Freq: Once | ORAL | Status: AC
  Administered 2014-05-13: 60 mg via ORAL
  Filled 2014-05-13: qty 3

## 2014-05-13 MED ORDER — ONDANSETRON 4 MG PO TBDP
4.0000 mg | ORAL_TABLET | Freq: Once | ORAL | Status: AC
  Administered 2014-05-13: 4 mg via ORAL
  Filled 2014-05-13: qty 1

## 2014-05-13 MED ORDER — ONDANSETRON HCL 4 MG PO TABS: 4.0000 mg | ORAL_TABLET | Freq: Four times a day (QID) | ORAL | Status: DC

## 2014-05-13 MED ORDER — OXYCODONE-ACETAMINOPHEN 5-325 MG PO TABS
2.0000 | ORAL_TABLET | Freq: Once | ORAL | Status: AC
  Administered 2014-05-13: 2 via ORAL
  Filled 2014-05-13: qty 2

## 2014-05-13 MED ORDER — PREDNISONE 20 MG PO TABS: ORAL_TABLET | ORAL | Status: DC

## 2014-05-13 NOTE — ED Notes (Signed)
Pt c/o left sciatica pain that has gotten worse over the past couple of weeks to now making pt nauseated.  Pt saw Dr Marlou Sa and had an injection that didn't take, so pt states that she is being referred back to Dr Lorin Mercy.  Pt has an apt set up with Dr Lorin Mercy for Jan 5.

## 2014-05-13 NOTE — ED Provider Notes (Signed)
CSN: [id number]     Arrival date & time 05/13/14  1419 History  This chart was scribed for non-physician practitioner, Noland Fordyce, PA-C,  working with Ephraim Hamburger, MD, by Ian Bushman, ED Scribe. This patient was seen in room WTR7/WTR7 and the patient's care was started at 3:10 PM.   Chief Complaint  Patient presents with  . Sciatica     (Consider location/radiation/quality/duration/timing/severity/associated sxs/prior Treatment) The history is provided by the patient. No language interpreter was used.    HPI Comments: Madeline Singh is a 38 y.o. female who presents to the Emergency Department complaining of left sciatica pain with associated numbness in her left foot that has gotten worse over the past couple of weeks. She has already have had one surgery years ago and is up for consultation again in the next six days. Her last spinal injection was two weeks ago. Patient states that she is nausous and cant sleep.  She has increased pressure which she thinks might be the reason for her disc pain. Patient has seen Dr. Lorin Mercy a spinal surgeon, who performed her surgery years ago. Dr. Marlou Sa who she sees now sent her back to [facility] for a possible second surgery. She has mainly been on ibuprofen for the pain.  She has been on Tramodal, percocet in the past and takes small amounts of percocet occasionally.  She is not on muscle relaxors because she states that they do not work.She denies history of kidney stones, dysuria, and a fever.   Past Medical History  Diagnosis Date  . Chronic headaches   . Bursitis   . Sciatica    Past Surgical History  Procedure Laterality Date  . Back surgery    . Cholecystectomy    . Laproscopic & debriding of right shoulder     Family History  Problem Relation Age of Onset  . Colon cancer Mother 103    Stage 4  . Colon polyps Sister     Pre-Cancer Polyp   . Colon cancer Paternal Uncle    History  Substance Use Topics  . Smoking status: Current Every  Day Smoker -- 0.50 packs/day    Types: Cigarettes  . Smokeless tobacco: Never Used     Comment: Counseling sheet given in exam room to quit smoking   . Alcohol Use: No   OB History    No data available     Review of Systems  Constitutional: Negative for fever and chills.  Gastrointestinal: Positive for nausea.  Musculoskeletal: Positive for back pain.  Neurological: Positive for numbness.      Allergies  Review of patient's allergies indicates no known allergies.  Home Medications   Prior to Admission medications   Medication Sig Start Date End Date Taking? Authorizing Provider  clonazePAM (KLONOPIN) 0.5 MG tablet  03/09/14   Historical Provider, MD  FLUoxetine (PROZAC) 20 MG capsule  02/06/14   Historical Provider, MD  gabapentin (NEURONTIN) 300 MG capsule Take 600 mg by mouth 2 (two) times daily.    Historical Provider, MD  HYDROcodone-acetaminophen (NORCO/VICODIN) 5-325 MG per tablet Take 1 tablet by mouth every 6 (six) hours as needed for moderate pain. 03/17/14   Merryl Hacker, MD  ibuprofen (ADVIL) 200 MG tablet Take 800 mg by mouth every 6 (six) hours as needed. For pain    Historical Provider, MD  methocarbamol (ROBAXIN) 500 MG tablet Take 1 tablet (500 mg total) by mouth every 8 (eight) hours as needed for muscle spasms (or pain). 08/23/13  Clayton Bibles, PA-C  ondansetron (ZOFRAN) 4 MG tablet Take 1 tablet (4 mg total) by mouth every 6 (six) hours. 05/13/14   Noland Fordyce, PA-C  oxyCODONE-acetaminophen (PERCOCET/ROXICET) 5-325 MG per tablet Take 1-2 tablets by mouth every 4 (four) hours as needed for moderate pain or severe pain. 05/13/14   Noland Fordyce, PA-C  predniSONE (DELTASONE) 20 MG tablet 3 tabs po day one, then 2 po daily x 4 days 05/13/14   Noland Fordyce, PA-C   BP 107/62 mmHg  Pulse 78  Temp(Src) 98.7 F (37.1 C) (Oral)  Resp 17  SpO2 97%  LMP 04/13/2014 Physical Exam  Constitutional: She is oriented to person, place, and time. She appears  well-developed and well-nourished.  HENT:  Head: Normocephalic and atraumatic.  Eyes: EOM are normal.  Neck: Normal range of motion.  Cardiovascular: Normal rate.   Pulses:      Dorsalis pedis pulses are 2+ on the right side, and 2+ on the left side.       Posterior tibial pulses are 2+ on the right side, and 2+ on the left side.  Pulmonary/Chest: Effort normal.  Musculoskeletal: Normal range of motion.  Well healed surgical scar over lumbar spine Tenderness to muscles of left lumbar spine and left buttock  No midline spinal tenderness Increase pain with full hip flexion on left side 4/5 strength in left lower extremity compared to right due to pain  antalgic gait  Neurological: She is alert and oriented to person, place, and time.  Sensation intact in bilateral lower extremities   Skin: Skin is warm and dry.  Psychiatric: She has a normal mood and affect. Her behavior is normal.  Nursing note and vitals reviewed.   ED Course  Procedures (including critical care time) DIAGNOSTIC STUDIES: Oxygen Saturation is 97% on RA, normal by my interpretation.    COORDINATION OF CARE: 3:15 PM Discussed treatment plan with patient at beside, the patient agrees with the plan and has no further questions at this time.  Labs Review Labs Reviewed - No data to display  Imaging Review No results found.   EKG Interpretation None      MDM   Final diagnoses:  Left-sided low back pain with left-sided sciatica  Nausea   Pt presenting to ED with c/o worsening left sided lower back pain and sciatica, no near symptoms besides worsening pain. No new falls or injuries. Pt requesting pain medication as she has f/u with Dr. Lorin Mercy for likely second spinal surgery on 05/19/13.  Pt is afebrile. Previous surgical scar appears well healed. Not concerned for underlying infection at this time. Not concerned for cauda equina or spinal abscess. No emergent imaging indicated at this time. Rx: percocet,  prednison, and zofran for reported nausea. Advised to f/u with Dr. Lorin Mercy as previously scheduled for Jan 5th. Return precautions provided. Pt verbalized understanding and agreement with tx plan.   I personally performed the services described in this documentation, which was scribed in my presence. The recorded information has been reviewed and is accurate.    Noland Fordyce, PA-C 05/13/14 [place], MD 05/14/14 [phone]

## 2014-06-05 ENCOUNTER — Institutional Professional Consult (permissible substitution): Payer: Medicaid Other | Admitting: Neurology

## 2014-06-09 ENCOUNTER — Ambulatory Visit: Payer: Self-pay | Admitting: Neurology

## 2014-06-26 ENCOUNTER — Ambulatory Visit: Payer: Self-pay | Admitting: Neurology

## 2014-07-06 ENCOUNTER — Encounter (HOSPITAL_COMMUNITY): Payer: Self-pay | Admitting: Emergency Medicine

## 2014-07-06 ENCOUNTER — Emergency Department (HOSPITAL_COMMUNITY)
Admission: EM | Admit: 2014-07-06 | Discharge: 2014-07-06 | Disposition: A | Discharge status: Home / Self Care | Payer: Medicaid Other | Attending: Emergency Medicine | Admitting: Emergency Medicine

## 2014-07-06 DIAGNOSIS — Z79899 Other long term (current) drug therapy: Secondary | ICD-10-CM | POA: Diagnosis not present

## 2014-07-06 DIAGNOSIS — Z720 Tobacco use: Secondary | ICD-10-CM | POA: Insufficient documentation

## 2014-07-06 DIAGNOSIS — M5430 Sciatica, unspecified side: Secondary | ICD-10-CM | POA: Insufficient documentation

## 2014-07-06 DIAGNOSIS — R51 Headache: Secondary | ICD-10-CM | POA: Insufficient documentation

## 2014-07-06 DIAGNOSIS — G8929 Other chronic pain: Secondary | ICD-10-CM | POA: Diagnosis not present

## 2014-07-06 DIAGNOSIS — Z7952 Long term (current) use of systemic steroids: Secondary | ICD-10-CM | POA: Insufficient documentation

## 2014-07-06 DIAGNOSIS — M545 Low back pain: Secondary | ICD-10-CM

## 2014-07-06 DIAGNOSIS — R519 Headache, unspecified: Secondary | ICD-10-CM

## 2014-07-06 MED ORDER — OXYCODONE-ACETAMINOPHEN 5-325 MG PO TABS
1.0000 | ORAL_TABLET | Freq: Once | ORAL | Status: AC
  Administered 2014-07-06: 1 via ORAL
  Filled 2014-07-06: qty 1

## 2014-07-06 MED ORDER — SODIUM CHLORIDE 0.9 % IV BOLUS (SEPSIS)
1000.0000 mL | Freq: Once | INTRAVENOUS | Status: AC
  Administered 2014-07-06: 1000 mL via INTRAVENOUS

## 2014-07-06 MED ORDER — METOCLOPRAMIDE HCL 5 MG/ML IJ SOLN
10.0000 mg | Freq: Once | INTRAMUSCULAR | Status: AC
  Administered 2014-07-06: 10 mg via INTRAVENOUS
  Filled 2014-07-06: qty 2

## 2014-07-06 MED ORDER — KETOROLAC TROMETHAMINE 30 MG/ML IJ SOLN
30.0000 mg | Freq: Once | INTRAMUSCULAR | Status: AC
  Administered 2014-07-06: 30 mg via INTRAVENOUS
  Filled 2014-07-06: qty 1

## 2014-07-06 MED ORDER — DIPHENHYDRAMINE HCL 50 MG/ML IJ SOLN
12.5000 mg | Freq: Once | INTRAMUSCULAR | Status: AC
  Administered 2014-07-06: 12.5 mg via INTRAVENOUS
  Filled 2014-07-06: qty 1

## 2014-07-06 NOTE — ED Provider Notes (Signed)
CSN: [id number]     Arrival date & time 07/06/14  1030 History   First MD Initiated Contact with Patient 07/06/14 1123     Chief Complaint  Patient presents with  . Migraine  . Back Pain     (Consider location/radiation/quality/duration/timing/severity/associated sxs/prior Treatment) HPI  Madeline Singh is a 39 y.o. female with PMH of chronic headaches, sciatica presenting with severe headache since 5 AM this morning. Patient states is like other headaches she has had before with gradual onset that is bilateral and throbbing. Patient denies any visual changes, slurred speech, weakness. She does endorse nausea but no emesis. No fevers, chills or head injury. Patient has been taking Tylenol and ibuprofen without relief. Patient also with chronic low back pain followed by orthopedics. She has appointment with neurosurgery next week. Pain is left-sided and she reports heavy lifting recently. No numbness, tingling or weakness she reports pain at times run down left leg. No saddle anesthesia or bladder or bowel incontinence. No abdominal pain or urinary symptoms    Past Medical History  Diagnosis Date  . Chronic headaches   . Bursitis   . Sciatica    Past Surgical History  Procedure Laterality Date  . Back surgery    . Cholecystectomy    . Laproscopic & debriding of right shoulder     Family History  Problem Relation Age of Onset  . Colon cancer Mother 33    Stage 4  . Colon polyps Sister     Pre-Cancer Polyp   . Colon cancer Paternal Uncle    History  Substance Use Topics  . Smoking status: Current Every Day Smoker -- 0.50 packs/day    Types: Cigarettes  . Smokeless tobacco: Never Used     Comment: Counseling sheet given in exam room to quit smoking   . Alcohol Use: No   OB History    No data available     Review of Systems 10 Systems reviewed and are negative for acute change except as noted in the HPI.    Allergies  Review of patient's allergies indicates no known  allergies.  Home Medications   Prior to Admission medications   Medication Sig Start Date End Date Taking? Authorizing Provider  FLUoxetine (PROZAC) 40 MG capsule Take 40 mg by mouth at bedtime.   Yes Historical Provider, MD  gabapentin (NEURONTIN) 300 MG capsule Take 600 mg by mouth 2 (two) times daily.   Yes Historical Provider, MD  ibuprofen (ADVIL) 200 MG tablet Take 800 mg by mouth every 6 (six) hours as needed. For pain   Yes Historical Provider, MD  HYDROcodone-acetaminophen (NORCO/VICODIN) 5-325 MG per tablet Take 1 tablet by mouth every 6 (six) hours as needed for moderate pain. Patient not taking: Reported on 07/06/2014 03/17/14   Merryl Hacker, MD  methocarbamol (ROBAXIN) 500 MG tablet Take 1 tablet (500 mg total) by mouth every 8 (eight) hours as needed for muscle spasms (or pain). Patient not taking: Reported on 07/06/2014 08/23/13   Clayton Bibles, PA-C  ondansetron (ZOFRAN) 4 MG tablet Take 1 tablet (4 mg total) by mouth every 6 (six) hours. Patient not taking: Reported on 07/06/2014 05/13/14   Noland Fordyce, PA-C  oxyCODONE-acetaminophen (PERCOCET/ROXICET) 5-325 MG per tablet Take 1-2 tablets by mouth every 4 (four) hours as needed for moderate pain or severe pain. Patient not taking: Reported on 07/06/2014 05/13/14   Noland Fordyce, PA-C  predniSONE (DELTASONE) 20 MG tablet 3 tabs po day one, then 2 po daily x  4 days Patient not taking: Reported on 07/06/2014 05/13/14   Noland Fordyce, PA-C   BP 101/65 mmHg  Pulse 50  Temp(Src) 98.4 F (36.9 C) (Oral)  Resp 16  SpO2 98%  LMP 06/09/2014 Physical Exam  Constitutional: She appears well-developed and well-nourished. No distress.  HENT:  Head: Normocephalic and atraumatic.  Mouth/Throat: Oropharynx is clear and moist.  Eyes: Conjunctivae and EOM are normal. Pupils are equal, round, and reactive to light. Right eye exhibits no discharge. Left eye exhibits no discharge.  Neck: Normal range of motion. Neck supple.  No nuchal  rigidity  Cardiovascular: Normal rate and regular rhythm.   Pulmonary/Chest: Effort normal and breath sounds normal. No respiratory distress. She has no wheezes.  Abdominal: Soft. Bowel sounds are normal. She exhibits no distension. There is no tenderness.  Neurological: She is alert. No cranial nerve deficit. Coordination normal.  Speech is clear and goal oriented. Peripheral visual fields intact. Strength 5/5 in upper and lower extremities. Sensation intact. DTR equal and intact. Intact rapid alternating movements, finger to nose, and heel to shin. Negative Romberg. No pronator drift. Negative straight leg raise. Normal gait.   Skin: Skin is warm and dry. She is not diaphoretic.  Nursing note and vitals reviewed.   ED Course  Procedures (including critical care time) Labs Review Labs Reviewed - No data to display  Imaging Review No results found.   EKG Interpretation None      MDM   Final diagnoses:  Acute nonintractable headache, unspecified headache type  Left low back pain, with sciatica presence unspecified   Pt HA treated and improved while in ED.  Presentation is like pt's typical HA, gradual in onset, not maximal in onset, and not worse of life. No visual or speech changes, no vomiting, and no weakness. Pt is afebrile with no focal neuro deficits or nuchal rigidity. I doubt SAH, ICH, meningits. Pt is to follow up with PCP.  Patient with chronic back pain with neurosurgeon follow up next week. No loss of bowel or bladder control. No saddle anesthesia.  VSS. No neurological deficits and normal neuro exam. Tx with RICE protocol and follow up with neurosurgeon.   Discussed return precautions with patient. Discussed all results and patient verbalizes understanding and agrees with plan.   Pura Spice, PA-C 07/06/14 [place], MD 07/06/14 [id number]

## 2014-07-06 NOTE — ED Notes (Signed)
Complaint of severe headache, nausea since 5 a.m. Hx of migraines, states this feels like a flare up of her typical migraines. Also complaining of her "chronic low back pain from my L5 disk buldging."

## 2014-07-06 NOTE — Discharge Instructions (Signed)
Return to the emergency room with worsening of symptoms, new symptoms or with symptoms that are concerning , especially fevers, loss of control of bladder or bowels, numbness or tingling around genital region or anus, weakness OR , especially severe worsening of headache, visual or speech changes, weakness in face, arms or legs. RICE: Rest, Ice (three cycles of 20 mins on, 18mins off at least twice a day), compression/brace, elevation. Heating pad works well for back pain. Ibuprofen 400mg  (2 tablets 200mg ) every 5-6 hours for 3-5 days. Follow up with PCP for headaches and follow up with neurosurgeon for back pain as scheduled. Read below information and follow recommendations.  General Headache Without Cause A headache is pain or discomfort felt around the head or neck area. The specific cause of a headache may not be found. There are many causes and types of headaches. A few common ones are:  Tension headaches.  Migraine headaches.  Cluster headaches.  Chronic daily headaches. HOME CARE INSTRUCTIONS   Keep all follow-up appointments with your caregiver or any specialist referral.  Only take over-the-counter or prescription medicines for pain or discomfort as directed by your caregiver.  Lie down in a dark, quiet room when you have a headache.  Keep a headache journal to find out what may trigger your migraine headaches. For example, write down:  What you eat and drink.  How much sleep you get.  Any change to your diet or medicines.  Try massage or other relaxation techniques.  Put ice packs or heat on the head and neck. Use these 3 to 4 times per day for 15 to 20 minutes each time, or as needed.  Limit stress.  Sit up straight, and do not tense your muscles.  Quit smoking if you smoke.  Limit alcohol use.  Decrease the amount of caffeine you drink, or stop drinking caffeine.  Eat and sleep on a regular schedule.  Get 7 to 9 hours of sleep, or as recommended by your  caregiver.  Keep lights dim if bright lights bother you and make your headaches worse. SEEK MEDICAL CARE IF:   You have problems with the medicines you were prescribed.  Your medicines are not working.  You have a change from the usual headache.  You have nausea or vomiting. SEEK IMMEDIATE MEDICAL CARE IF:   Your headache becomes severe.  You have a fever.  You have a stiff neck.  You have loss of vision.  You have muscular weakness or loss of muscle control.  You start losing your balance or have trouble walking.  You feel faint or pass out.  You have severe symptoms that are different from your first symptoms. MAKE SURE YOU:   Understand these instructions.  Will watch your condition.  Will get help right away if you are not doing well or get worse. Document Released: 05/01/2005 Document Revised: 07/24/2011 Document Reviewed: 05/17/2011 [facility] Patient Information 2015 [place], [place]. This information is not intended to replace advice given to you by your health care provider. Make sure you discuss any questions you have with your health care provider.  Chronic Back Pain  When back pain lasts longer than 3 months, it is called chronic back pain.People with chronic back pain often go through certain periods that are more intense (flare-ups).  CAUSES Chronic back pain can be caused by wear and tear (degeneration) on different structures in your back. These structures include:  The bones of your spine (vertebrae) and the joints surrounding your spinal cord  and nerve roots (facets).  The strong, fibrous tissues that connect your vertebrae (ligaments). Degeneration of these structures may result in pressure on your nerves. This can lead to constant pain. HOME CARE INSTRUCTIONS  Avoid bending, heavy lifting, prolonged sitting, and activities which make the problem worse.  Take brief periods of rest throughout the day to reduce your pain. Lying down or standing  usually is better than sitting while you are resting.  Take over-the-counter or prescription medicines only as directed by your caregiver. SEEK IMMEDIATE MEDICAL CARE IF:   You have weakness or numbness in one of your legs or feet.  You have trouble controlling your bladder or bowels.  You have nausea, vomiting, abdominal pain, shortness of breath, or fainting. Document Released: 06/08/2004 Document Revised: 07/24/2011 Document Reviewed: 04/15/2011 [facility] Patient Information 2015 [place], [place]. This information is not intended to replace advice given to you by your health care provider. Make sure you discuss any questions you have with your health care provider.

## 2014-07-13 ENCOUNTER — Encounter: Payer: Self-pay | Admitting: Neurology

## 2014-07-13 ENCOUNTER — Ambulatory Visit: Payer: Medicaid Other | Admitting: Neurology

## 2014-07-15 ENCOUNTER — Encounter: Payer: Self-pay | Admitting: Neurology

## 2014-10-14 ENCOUNTER — Encounter (HOSPITAL_COMMUNITY): Payer: Self-pay | Admitting: Emergency Medicine

## 2014-10-14 ENCOUNTER — Emergency Department (HOSPITAL_COMMUNITY)
Admission: EM | Admit: 2014-10-14 | Discharge: 2014-10-14 | Disposition: A | Discharge status: Home / Self Care | Payer: Medicaid Other | Attending: Emergency Medicine | Admitting: Emergency Medicine

## 2014-10-14 DIAGNOSIS — Z79899 Other long term (current) drug therapy: Secondary | ICD-10-CM | POA: Diagnosis not present

## 2014-10-14 DIAGNOSIS — Z720 Tobacco use: Secondary | ICD-10-CM | POA: Insufficient documentation

## 2014-10-14 DIAGNOSIS — M5432 Sciatica, left side: Secondary | ICD-10-CM | POA: Diagnosis not present

## 2014-10-14 DIAGNOSIS — M549 Dorsalgia, unspecified: Secondary | ICD-10-CM | POA: Diagnosis present

## 2014-10-14 MED ORDER — HYDROCODONE-ACETAMINOPHEN 5-325 MG PO TABS
2.0000 | ORAL_TABLET | Freq: Once | ORAL | Status: AC
  Administered 2014-10-14: 2 via ORAL
  Filled 2014-10-14: qty 2

## 2014-10-14 MED ORDER — PREDNISONE 10 MG PO TABS: 20.0000 mg | ORAL_TABLET | Freq: Every day | ORAL | Status: DC

## 2014-10-14 MED ORDER — HYDROCODONE-ACETAMINOPHEN 5-325 MG PO TABS: 1.0000 | ORAL_TABLET | ORAL | Status: DC | PRN

## 2014-10-14 MED ORDER — PREDNISONE 20 MG PO TABS
60.0000 mg | ORAL_TABLET | Freq: Once | ORAL | Status: AC
  Administered 2014-10-14: 60 mg via ORAL
  Filled 2014-10-14: qty 3

## 2014-10-14 NOTE — Discharge Instructions (Signed)
Sciatica Sciatica is pain, weakness, numbness, or tingling along the path of the sciatic nerve. The nerve starts in the lower back and runs down the back of each leg. The nerve controls the muscles in the lower leg and in the back of the knee, while also providing sensation to the back of the thigh, lower leg, and the sole of your foot. Sciatica is a symptom of another medical condition. For instance, nerve damage or certain conditions, such as a herniated disk or bone spur on the spine, pinch or put pressure on the sciatic nerve. This causes the pain, weakness, or other sensations normally associated with sciatica. Generally, sciatica only affects one side of the body. CAUSES   Herniated or slipped disc.  Degenerative disk disease.  A pain disorder involving the narrow muscle in the buttocks (piriformis syndrome).  Pelvic injury or fracture.  Pregnancy.  Tumor (rare). SYMPTOMS  Symptoms can vary from mild to very severe. The symptoms usually travel from the low back to the buttocks and down the back of the leg. Symptoms can include:  Mild tingling or dull aches in the lower back, leg, or hip.  Numbness in the back of the calf or sole of the foot.  Burning sensations in the lower back, leg, or hip.  Sharp pains in the lower back, leg, or hip.  Leg weakness.  Severe back pain inhibiting movement. These symptoms may get worse with coughing, sneezing, laughing, or prolonged sitting or standing. Also, being overweight may worsen symptoms. DIAGNOSIS  Your caregiver will perform a physical exam to look for common symptoms of sciatica. He or she may ask you to do certain movements or activities that would trigger sciatic nerve pain. Other tests may be performed to find the cause of the sciatica. These may include:  Blood tests.  X-rays.  Imaging tests, such as an MRI or CT scan. TREATMENT  Treatment is directed at the cause of the sciatic pain. Sometimes, treatment is not necessary  and the pain and discomfort goes away on its own. If treatment is needed, your caregiver may suggest:  Over-the-counter medicines to relieve pain.  Prescription medicines, such as anti-inflammatory medicine, muscle relaxants, or narcotics.  Applying heat or ice to the painful area.  Steroid injections to lessen pain, irritation, and inflammation around the nerve.  Reducing activity during periods of pain.  Exercising and stretching to strengthen your abdomen and improve flexibility of your spine. Your caregiver may suggest losing weight if the extra weight makes the back pain worse.  Physical therapy.  Surgery to eliminate what is pressing or pinching the nerve, such as a bone spur or part of a herniated disk. HOME CARE INSTRUCTIONS   Only take over-the-counter or prescription medicines for pain or discomfort as directed by your caregiver.  Apply ice to the affected area for 20 minutes, 3-4 times a day for the first 48-72 hours. Then try heat in the same way.  Exercise, stretch, or perform your usual activities if these do not aggravate your pain.  Attend physical therapy sessions as directed by your caregiver.  Keep all follow-up appointments as directed by your caregiver.  Do not wear high heels or shoes that do not provide proper support.  Check your mattress to see if it is too soft. A firm mattress may lessen your pain and discomfort. SEEK IMMEDIATE MEDICAL CARE IF:   You lose control of your bowel or bladder (incontinence).  You have increasing weakness in the lower back, pelvis, buttocks,   or legs.  You have redness or swelling of your back.  You have a burning sensation when you urinate.  You have pain that gets worse when you lie down or awakens you at night.  Your pain is worse than you have experienced in the past.  Your pain is lasting longer than 4 weeks.  You are suddenly losing weight without reason. MAKE SURE YOU:  Understand these  instructions.  Will watch your condition.  Will get help right away if you are not doing well or get worse. Document Released: 04/25/2001 Document Revised: 10/31/2011 Document Reviewed: 09/10/2011 [employer] Patient Information 2015 [employer]. This information is not intended to replace advice given to you by your health care provider. Make sure you discuss any questions you have with your health care provider.  

## 2014-10-14 NOTE — ED Provider Notes (Signed)
CSN: [id number]     Arrival date & time 10/14/14  1054 History   First MD Initiated Contact with Patient 10/14/14 1110     Chief Complaint  Patient presents with  . Back Pain     (Consider location/radiation/quality/duration/timing/severity/associated sxs/prior Treatment) HPI    PCP: No PCP Per Patient Blood pressure 130/83, pulse 70, temperature 97.8 F (36.6 C), temperature source Oral, last menstrual period 10/10/2014, SpO2 100 %.  Madeline Singh is a 39 y.o.female with a significant PMH of chronic headaches, bursitis, sciatica presents to the ER with complaints of sciatica exacerbation to the left hip/leg. She has chronic back pain and see's doctor Lorin Mercy, she has had surgery in the past and was told she needs to have a discectomy. She typically does well on Ibuprofen and Tylenol. She has only had 1 visit to the ER in the past month and is not on chronic pain medications. No weakness. She reports doing spring cleaning this past weekend and developing the sciatica 48 hours ago but unable to alleviate pain with OTC pain medications.  Negative Review of Symptoms: Dysuria, vaginal discharge, suprapubic pain, hematuria, low back pain, nausea, vomiting, diarrhea, flank pain, bowel or urine incontinence, syncope, chills, weight loss     Past Medical History  Diagnosis Date  . Chronic headaches   . Bursitis   . Sciatica    Past Surgical History  Procedure Laterality Date  . Back surgery    . Cholecystectomy    . Laproscopic & debriding of right shoulder    . Back surgery     Family History  Problem Relation Age of Onset  . Colon cancer Mother 89    Stage 4  . Colon polyps Sister     Pre-Cancer Polyp   . Colon cancer Paternal Uncle   . High blood pressure Father    History  Substance Use Topics  . Smoking status: Current Every Day Smoker -- 0.50 packs/day    Types: Cigarettes  . Smokeless tobacco: Never Used     Comment: Counseling sheet given in exam room to quit smoking    . Alcohol Use: No   OB History    No data available     Review of Systems   10 Systems reviewed and are negative for acute change except as noted in the HPI.   s  Allergies  Review of patient's allergies indicates no known allergies.  Home Medications   Prior to Admission medications   Medication Sig Start Date End Date Taking? Authorizing Provider  FLUoxetine (PROZAC) 40 MG capsule Take 40 mg by mouth at bedtime.    Historical Provider, MD  gabapentin (NEURONTIN) 300 MG capsule Take 600 mg by mouth 2 (two) times daily.    Historical Provider, MD  HYDROcodone-acetaminophen (NORCO/VICODIN) 5-325 MG per tablet Take 1 tablet by mouth every 4 (four) hours as needed. 10/14/14   Dandria Griego Carlota Raspberry, PA-C  ibuprofen (ADVIL) 200 MG tablet Take 800 mg by mouth every 6 (six) hours as needed. For pain    Historical Provider, MD  methocarbamol (ROBAXIN) 500 MG tablet Take 1 tablet (500 mg total) by mouth every 8 (eight) hours as needed for muscle spasms (or pain). Patient not taking: Reported on 07/06/2014 08/23/13   Clayton Bibles, PA-C  ondansetron (ZOFRAN) 4 MG tablet Take 1 tablet (4 mg total) by mouth every 6 (six) hours. Patient not taking: Reported on 07/06/2014 05/13/14   Noland Fordyce, PA-C  oxyCODONE-acetaminophen (PERCOCET/ROXICET) 5-325 MG per tablet Take 1-2 tablets  by mouth every 4 (four) hours as needed for moderate pain or severe pain. Patient not taking: Reported on 07/06/2014 05/13/14   Noland Fordyce, PA-C  predniSONE (DELTASONE) 10 MG tablet Take 2 tablets (20 mg total) by mouth daily. 10/14/14   Jareth Pardee Carlota Raspberry, PA-C  traMADol (ULTRAM) 50 MG tablet Take 50 mg by mouth daily. 06/22/14   Historical Provider, MD   BP 130/83 mmHg  Pulse 70  Temp(Src) 97.8 F (36.6 C) (Oral)  SpO2 100%  LMP 10/10/2014 Physical Exam  Constitutional: She appears well-developed and well-nourished. No distress.  HENT:  Head: Normocephalic and atraumatic.  Eyes: Pupils are equal, round, and reactive to light.   Neck: Normal range of motion. Neck supple.  Cardiovascular: Normal rate and regular rhythm.   Pulmonary/Chest: Effort normal.  Abdominal: Soft.  Musculoskeletal:  Pt has equal strength to bilateral lower extremities.  Neurosensory function adequate to both legs No clonus on dorsiflextion Skin color is normal. Skin is warm and moist.  I see no step off deformity, no midline bony tenderness.  Pt is able to ambulate but walks slowly and with limp due to pain  No crepitus, laceration, effusion, induration, lesions, swelling.   Pedal pulses are symmetrical and palpable bilaterally  no tenderness to palpation of paraspinel or midline muscles   Neurological: She is alert.  Skin: Skin is warm and dry.  Nursing note and vitals reviewed.   ED Course  Procedures (including critical care time) Labs Review Labs Reviewed - No data to display  Imaging Review No results found.   EKG Interpretation None      MDM   Final diagnoses:  Sciatica, left    39 y.o.[name]  with back pain. No neurological deficits and normal neuro exam. Patient can walk. No loss of bowel or bladder control. No concern for cauda equina at this time base on HPI and physical exam findings. No fever, night sweats, weight loss, h/o cancer, IVDU.   Patient Plan 1. Medications: Prednisone and Vicodin (#15 tabs). Cont usual home medications unless otherwise directed. 2. Treatment: rest, drink plenty of fluids, gentle stretching as discussed, alternate ice and heat  3. Follow Up: Please followup with your primary doctor for discussion of your diagnoses and further evaluation after today's visit; if you do not have a primary care doctor use the resource guide provided to find one  Advised to follow-up with the orthopedist if symptoms do not start to resolve in the next 2-3 days. If develop loss of bowel or urinary control return to the ED as soon as possible for further evaluation. To take the medications as  prescribed as they can cause harm if not taken appropriately.   Vital signs are stable at discharge. Filed Vitals:   10/14/14 1101  BP: 130/83  Pulse: 70  Temp: 97.8 F (36.6 C)    Patient/guardian has voiced understanding and agreed to follow-up with the PCP or specialist.       Delos Haring, PA-C 10/14/14 [place], MD 10/14/14 [phone]

## 2014-10-14 NOTE — ED Notes (Signed)
Pt reports acute chronic lower back pain/L5 onset yesterday. Pt reports "more walking than normal."

## 2015-06-22 ENCOUNTER — Encounter (HOSPITAL_COMMUNITY): Payer: Self-pay | Admitting: Emergency Medicine

## 2015-06-22 ENCOUNTER — Emergency Department (HOSPITAL_COMMUNITY)
Admission: EM | Admit: 2015-06-22 | Discharge: 2015-06-22 | Disposition: A | Discharge status: Home / Self Care | Payer: Medicaid Other | Attending: Emergency Medicine | Admitting: Emergency Medicine

## 2015-06-22 DIAGNOSIS — Z79899 Other long term (current) drug therapy: Secondary | ICD-10-CM | POA: Diagnosis not present

## 2015-06-22 DIAGNOSIS — R05 Cough: Secondary | ICD-10-CM | POA: Diagnosis present

## 2015-06-22 DIAGNOSIS — Z7952 Long term (current) use of systemic steroids: Secondary | ICD-10-CM | POA: Insufficient documentation

## 2015-06-22 DIAGNOSIS — F17210 Nicotine dependence, cigarettes, uncomplicated: Secondary | ICD-10-CM | POA: Diagnosis not present

## 2015-06-22 DIAGNOSIS — G8929 Other chronic pain: Secondary | ICD-10-CM | POA: Diagnosis not present

## 2015-06-22 DIAGNOSIS — M545 Low back pain: Secondary | ICD-10-CM | POA: Insufficient documentation

## 2015-06-22 DIAGNOSIS — M5432 Sciatica, left side: Secondary | ICD-10-CM

## 2015-06-22 DIAGNOSIS — H6691 Otitis media, unspecified, right ear: Secondary | ICD-10-CM | POA: Diagnosis not present

## 2015-06-22 MED ORDER — TRAMADOL HCL 50 MG PO TABS: 50.0000 mg | ORAL_TABLET | Freq: Four times a day (QID) | ORAL | Status: DC | PRN

## 2015-06-22 MED ORDER — TRAMADOL HCL 50 MG PO TABS
50.0000 mg | ORAL_TABLET | Freq: Once | ORAL | Status: AC
  Administered 2015-06-22: 50 mg via ORAL
  Filled 2015-06-22: qty 1

## 2015-06-22 MED ORDER — AMOXICILLIN-POT CLAVULANATE 875-125 MG PO TABS
1.0000 | ORAL_TABLET | Freq: Once | ORAL | Status: AC
  Administered 2015-06-22: 1 via ORAL
  Filled 2015-06-22: qty 1

## 2015-06-22 MED ORDER — AMOXICILLIN-POT CLAVULANATE 875-125 MG PO TABS: 1.0000 | ORAL_TABLET | Freq: Two times a day (BID) | ORAL | Status: DC

## 2015-06-22 NOTE — ED Provider Notes (Signed)
CSN: [id number]     Arrival date & time 06/22/15  1146 History  By signing my name below, I, Madeline Singh, attest that this documentation has been prepared under the direction and in the presence of non-physician practitioner, Madeline Garre, PA-C. Electronically Signed: Evelene Singh, Scribe. 06/22/2015. 1:08 PM.    Chief Complaint  Patient presents with  . Cough    The history is provided by the patient. No language interpreter was used.     HPI Comments:  Madeline Singh is a 40 y.o. female who presents to the Emergency Department complaining of a persistent cough that is occasionally productive x 5-6 days. She reports associated congestion, frontal HA, right ear pain  and sore throat which she describes as scratchy.  She has been taking advil cold and sinus and mucinex without relief. She denies fever.  Pt has a h/o sciatica; she notes her chronic lower back pain has been exacerbated in recent days secondary to cough. Her pain radiates down her LLE. No alleviating factors noted; states she doesn't usually take anything for her back pain.   Past Medical History  Diagnosis Date  . Chronic headaches   . Bursitis   . Sciatica    Past Surgical History  Procedure Laterality Date  . Back surgery    . Cholecystectomy    . Laproscopic & debriding of right shoulder    . Back surgery     Family History  Problem Relation Age of Onset  . Colon cancer Mother 13    Stage 4  . Colon polyps Sister     Pre-Cancer Polyp   . Colon cancer Paternal Uncle   . High blood pressure Father    Social History  Substance Use Topics  . Smoking status: Current Every Day Smoker -- 0.50 packs/day    Types: Cigarettes  . Smokeless tobacco: Never Used     Comment: Counseling sheet given in exam room to quit smoking   . Alcohol Use: No   OB History    No data available     Review of Systems  Constitutional: Negative for fever.  HENT: Positive for congestion, ear pain and sore throat.    Respiratory: Positive for cough.   Musculoskeletal: Positive for back pain.  Neurological: Positive for headaches.    Allergies  Review of patient's allergies indicates no known allergies.  Home Medications   Prior to Admission medications   Medication Sig Start Date End Date Taking? Authorizing Provider  amoxicillin-clavulanate (AUGMENTIN) 875-125 MG tablet Take 1 tablet by mouth every 12 (twelve) hours. 06/22/15   Madeline Singh Madeline Hollenkamp, PA-C  FLUoxetine (PROZAC) 40 MG capsule Take 40 mg by mouth at bedtime.    Historical Provider, MD  gabapentin (NEURONTIN) 300 MG capsule Take 600 mg by mouth 2 (two) times daily.    Historical Provider, MD  HYDROcodone-acetaminophen (NORCO/VICODIN) 5-325 MG per tablet Take 1 tablet by mouth every 4 (four) hours as needed. 10/14/14   Madeline Carlota Raspberry, PA-C  ibuprofen (ADVIL) 200 MG tablet Take 800 mg by mouth every 6 (six) hours as needed. For pain    Historical Provider, MD  methocarbamol (ROBAXIN) 500 MG tablet Take 1 tablet (500 mg total) by mouth every 8 (eight) hours as needed for muscle spasms (or pain). Patient not taking: Reported on 07/06/2014 08/23/13   Madeline Bibles, PA-C  ondansetron (ZOFRAN) 4 MG tablet Take 1 tablet (4 mg total) by mouth every 6 (six) hours. Patient not taking: Reported on 07/06/2014 05/13/14   Madeline Singh  Hilda Blades, PA-C  oxyCODONE-acetaminophen (PERCOCET/ROXICET) 5-325 MG per tablet Take 1-2 tablets by mouth every 4 (four) hours as needed for moderate pain or severe pain. Patient not taking: Reported on 07/06/2014 05/13/14   Madeline Fordyce, PA-C  predniSONE (DELTASONE) 10 MG tablet Take 2 tablets (20 mg total) by mouth daily. 10/14/14   Madeline Carlota Raspberry, PA-C  traMADol (ULTRAM) 50 MG tablet Take 50 mg by mouth daily. 06/22/14   Historical Provider, MD  traMADol (ULTRAM) 50 MG tablet Take 1 tablet (50 mg total) by mouth every 6 (six) hours as needed. 06/22/15   Madeline Singh Ismael Karge, PA-C   BP 107/73 mmHg  Pulse 78  Temp(Src) 98.2 F (36.8 C)  (Oral)  Resp 16  SpO2 98% Physical Exam  Constitutional: She is oriented to person, place, and time. She appears well-developed and well-nourished. No distress.  HENT:  Head: Normocephalic and atraumatic.  Right Ear: External ear normal. No mastoid tenderness. Tympanic membrane is erythematous.  Left Ear: Tympanic membrane and external ear normal. No mastoid tenderness.  Nose: Nose normal.  Mouth/Throat: Oropharynx is clear and moist. No oropharyngeal exudate.  Eyes: Conjunctivae are normal. Right eye exhibits no discharge. Left eye exhibits no discharge. No scleral icterus.  Cardiovascular: Normal rate.   Pulmonary/Chest: Effort normal and breath sounds normal.  Musculoskeletal:  L lumbar paraspinal muscle tenderness. Negative SLR.  Neurological: She is alert and oriented to person, place, and time. No cranial nerve deficit. She exhibits normal muscle tone. Coordination normal.  Strength 5/5 throughout. No sensory deficits.  No gait abnormality.  Skin: Skin is warm and dry. No rash noted. She is not diaphoretic. No erythema. No pallor.  Psychiatric: She has a normal mood and affect. Her behavior is normal.  Nursing note and vitals reviewed.   ED Course  Procedures   DIAGNOSTIC STUDIES:  Oxygen Saturation is 98% on RA, normal by my interpretation.    COORDINATION OF CARE:  12:52 PM Discussed treatment plan with pt at bedside and pt agreed to plan.   MDM   Final diagnoses:  Acute right otitis media, recurrence not specified, unspecified otitis media type  Sciatica of left side    Pt symptoms consistent with URI and exam is consistent with acute otitis media. No concern for acute mastoiditis, meningitis.  Pt also with back pain consistent with sciatica, this is acute on chronic pain. No neurological deficits and normal neuro exam. Patient is ambulatory.  No loss of bowel or bladder control.  No concern for cauda equina. Pt will be discharged with Ultram and Augmentin.  Supportive care and return precaution discussed. Pt is hemodynamically stable & in NAD prior to discharge.  I personally performed the services described in this documentation, which was scribed in my presence. The recorded information has been reviewed and is accurate.     Carlos Levering, PA-C 06/22/15 [place], DO 06/23/15 [phone]

## 2015-06-22 NOTE — ED Notes (Signed)
Pt c/o nonproductive cough and congestion x 2 days.  Pt states that she also has sciatica on lt side and would like to be seen for this as well.

## 2015-06-22 NOTE — Discharge Instructions (Signed)
Back Exercises If you have pain in your back, do these exercises 2-3 times each day or as told by your doctor. When the pain goes away, do the exercises once each day, but repeat the steps more times for each exercise (do more repetitions). If you do not have pain in your back, do these exercises once each day or as told by your doctor. EXERCISES Single Knee to Chest Do these steps 3-5 times in a row for each leg:  Lie on your back on a firm bed or the floor with your legs stretched out.  Bring one knee to your chest.  Hold your knee to your chest by grabbing your knee or thigh.  Pull on your knee until you feel a gentle stretch in your lower back.  Keep doing the stretch for 10-30 seconds.  Slowly let go of your leg and straighten it. Pelvic Tilt Do these steps 5-10 times in a row:  Lie on your back on a firm bed or the floor with your legs stretched out.  Bend your knees so they point up to the ceiling. Your feet should be flat on the floor.  Tighten your lower belly (abdomen) muscles to press your lower back against the floor. This will make your tailbone point up to the ceiling instead of pointing down to your feet or the floor.  Stay in this position for 5-10 seconds while you gently tighten your muscles and breathe evenly. Cat-Cow Do these steps until your lower back bends more easily:  Get on your hands and knees on a firm surface. Keep your hands under your shoulders, and keep your knees under your hips. You may put padding under your knees.  Let your head hang down, and make your tailbone point down to the floor so your lower back is round like the back of a cat.  Stay in this position for 5 seconds.  Slowly lift your head and make your tailbone point up to the ceiling so your back hangs low (sags) like the back of a cow.  Stay in this position for 5 seconds. Press-Ups Do these steps 5-10 times in a row:  Lie on your belly (face-down) on the floor.  Place your  hands near your head, about shoulder-width apart.  While you keep your back relaxed and keep your hips on the floor, slowly straighten your arms to raise the top half of your body and lift your shoulders. Do not use your back muscles. To make yourself more comfortable, you may change where you place your hands.  Stay in this position for 5 seconds.  Slowly return to lying flat on the floor. Bridges Do these steps 10 times in a row:  Lie on your back on a firm surface.  Bend your knees so they point up to the ceiling. Your feet should be flat on the floor.  Tighten your butt muscles and lift your butt off of the floor until your waist is almost as high as your knees. If you do not feel the muscles working in your butt and the back of your thighs, slide your feet 1-2 inches farther away from your butt.  Stay in this position for 3-5 seconds.  Slowly lower your butt to the floor, and let your butt muscles relax. If this exercise is too easy, try doing it with your arms crossed over your chest. Belly Crunches Do these steps 5-10 times in a row:  Lie on your back on a firm bed  or the floor with your legs stretched out.  Bend your knees so they point up to the ceiling. Your feet should be flat on the floor.  Cross your arms over your chest.  Tip your chin a little bit toward your chest but do not bend your neck.  Tighten your belly muscles and slowly raise your chest just enough to lift your shoulder blades a tiny bit off of the floor.  Slowly lower your chest and your head to the floor. Back Lifts Do these steps 5-10 times in a row:  Lie on your belly (face-down) with your arms at your sides, and rest your forehead on the floor.  Tighten the muscles in your legs and your butt.  Slowly lift your chest off of the floor while you keep your hips on the floor. Keep the back of your head in line with the curve in your back. Look at the floor while you do this.  Stay in this position  for 3-5 seconds.  Slowly lower your chest and your face to the floor. GET HELP IF:  Your back pain gets a lot worse when you do an exercise.  Your back pain does not lessen 2 hours after you exercise. If you have any of these problems, stop doing the exercises. Do not do them again unless your doctor says it is okay. GET HELP RIGHT AWAY IF:  You have sudden, very bad back pain. If this happens, stop doing the exercises. Do not do them again unless your doctor says it is okay.   This information is not intended to replace advice given to you by your health care provider. Make sure you discuss any questions you have with your health care provider.   Document Released: 06/03/2010 Document Revised: 01/20/2015 Document Reviewed: 06/25/2014 Elsevier Interactive Patient Education 2016 [employer].  Otitis Media, Adult Otitis media is redness, soreness, and inflammation of the middle ear. Otitis media may be caused by allergies or, most commonly, by infection. Often it occurs as a complication of the common cold. SIGNS AND SYMPTOMS Symptoms of otitis media may include:  Earache.  Fever.  Ringing in your ear.  Headache.  Leakage of fluid from the ear. DIAGNOSIS To diagnose otitis media, your health care provider will examine your ear with an otoscope. This is an instrument that allows your health care provider to see into your ear in order to examine your eardrum. Your health care provider also will ask you questions about your symptoms. TREATMENT  Typically, otitis media resolves on its own within 3-5 days. Your health care provider may prescribe medicine to ease your symptoms of pain. If otitis media does not resolve within 5 days or is recurrent, your health care provider may prescribe antibiotic medicines if he or she suspects that a bacterial infection is the cause. HOME CARE INSTRUCTIONS   If you were prescribed an antibiotic medicine, finish it all even if you start to feel  better.  Take medicines only as directed by your health care provider.  Keep all follow-up visits as directed by your health care provider. SEEK MEDICAL CARE IF:  You have otitis media only in one ear, or bleeding from your nose, or both.  You notice a lump on your neck.  You are not getting better in 3-5 days.  You feel worse instead of better. SEEK IMMEDIATE MEDICAL CARE IF:   You have pain that is not controlled with medicine.  You have swelling, redness, or pain around your  ear or stiffness in your neck.  You notice that part of your face is paralyzed.  You notice that the bone behind your ear (mastoid) is tender when you touch it. MAKE SURE YOU:   Understand these instructions.  Will watch your condition.  Will get help right away if you are not doing well or get worse.   This information is not intended to replace advice given to you by your health care provider. Make sure you discuss any questions you have with your health care provider.   Document Released: 02/04/2004 Document Revised: 05/22/2014 Document Reviewed: 11/26/2012 Elsevier Interactive Patient Education 2016 [employer] Inc[employer]  Sciatica Sciatica is pain, weakness, numbness, or tingling along the path of the sciatic nerve. The nerve starts in the lower back and runs down the back of each leg. The nerve controls the muscles in the lower leg and in the back of the knee, while also providing sensation to the back of the thigh, lower leg, and the sole of your foot. Sciatica is a symptom of another medical condition. For instance, nerve damage or certain conditions, such as a herniated disk or bone spur on the spine, pinch or put pressure on the sciatic nerve. This causes the pain, weakness, or other sensations normally associated with sciatica. Generally, sciatica only affects one side of the body. CAUSES   Herniated or slipped disc.  Degenerative disk disease.  A pain disorder involving the narrow muscle in the  buttocks (piriformis syndrome).  Pelvic injury or fracture.  Pregnancy.  Tumor (rare). SYMPTOMS  Symptoms can vary from mild to very severe. The symptoms usually travel from the low back to the buttocks and down the back of the leg. Symptoms can include:  Mild tingling or dull aches in the lower back, leg, or hip.  Numbness in the back of the calf or sole of the foot.  Burning sensations in the lower back, leg, or hip.  Sharp pains in the lower back, leg, or hip.  Leg weakness.  Severe back pain inhibiting movement. These symptoms may get worse with coughing, sneezing, laughing, or prolonged sitting or standing. Also, being overweight may worsen symptoms. DIAGNOSIS  Your caregiver will perform a physical exam to look for common symptoms of sciatica. He or she may ask you to do certain movements or activities that would trigger sciatic nerve pain. Other tests may be performed to find the cause of the sciatica. These may include:  Blood tests.  X-rays.  Imaging tests, such as an MRI or CT scan. TREATMENT  Treatment is directed at the cause of the sciatic pain. Sometimes, treatment is not necessary and the pain and discomfort goes away on its own. If treatment is needed, your caregiver may suggest:  Over-the-counter medicines to relieve pain.  Prescription medicines, such as anti-inflammatory medicine, muscle relaxants, or narcotics.  Applying heat or ice to the painful area.  Steroid injections to lessen pain, irritation, and inflammation around the nerve.  Reducing activity during periods of pain.  Exercising and stretching to strengthen your abdomen and improve flexibility of your spine. Your caregiver may suggest losing weight if the extra weight makes the back pain worse.  Physical therapy.  Surgery to eliminate what is pressing or pinching the nerve, such as a bone spur or part of a herniated disk. HOME CARE INSTRUCTIONS   Only take over-the-counter or  prescription medicines for pain or discomfort as directed by your caregiver.  Apply ice to the affected area for 20 minutes, 3-4  times a day for the first 48-72 hours. Then try heat in the same way.  Exercise, stretch, or perform your usual activities if these do not aggravate your pain.  Attend physical therapy sessions as directed by your caregiver.  Keep all follow-up appointments as directed by your caregiver.  Do not wear high heels or shoes that do not provide proper support.  Check your mattress to see if it is too soft. A firm mattress may lessen your pain and discomfort. SEEK IMMEDIATE MEDICAL CARE IF:   You lose control of your bowel or bladder (incontinence).  You have increasing weakness in the lower back, pelvis, buttocks, or legs.  You have redness or swelling of your back.  You have a burning sensation when you urinate.  You have pain that gets worse when you lie down or awakens you at night.  Your pain is worse than you have experienced in the past.  Your pain is lasting longer than 4 weeks.  You are suddenly losing weight without reason. MAKE SURE YOU:  Understand these instructions.  Will watch your condition.  Will get help right away if you are not doing well or get worse.   This information is not intended to replace advice given to you by your health care provider. Make sure you discuss any questions you have with your health care provider.  Follow up with your primary care provider if symptoms do not improve. Take anabiotic as prescribed. Return to the emergency department if you experience severe worsening of her symptoms, neck pain/stiffness, fever, chills, pain in the bone behind her ear, chest pain, shortness of breath, bowel or bladder incontinence, numbness or tingling in both extremities.

## 2016-03-21 ENCOUNTER — Encounter: Payer: Self-pay | Admitting: Gastroenterology

## 2017-03-07 ENCOUNTER — Emergency Department (HOSPITAL_COMMUNITY)
Admission: EM | Admit: 2017-03-07 | Discharge: 2017-03-07 | Disposition: A | Discharge status: Home / Self Care | Payer: Self-pay | Attending: Emergency Medicine | Admitting: Emergency Medicine

## 2017-03-07 ENCOUNTER — Emergency Department (HOSPITAL_COMMUNITY): Payer: Self-pay

## 2017-03-07 ENCOUNTER — Encounter (HOSPITAL_COMMUNITY): Payer: Self-pay | Admitting: Emergency Medicine

## 2017-03-07 DIAGNOSIS — R0981 Nasal congestion: Secondary | ICD-10-CM | POA: Insufficient documentation

## 2017-03-07 DIAGNOSIS — R110 Nausea: Secondary | ICD-10-CM | POA: Insufficient documentation

## 2017-03-07 DIAGNOSIS — R1084 Generalized abdominal pain: Secondary | ICD-10-CM | POA: Insufficient documentation

## 2017-03-07 DIAGNOSIS — R51 Headache: Secondary | ICD-10-CM | POA: Insufficient documentation

## 2017-03-07 DIAGNOSIS — J209 Acute bronchitis, unspecified: Secondary | ICD-10-CM

## 2017-03-07 LAB — COMPREHENSIVE METABOLIC PANEL
ALT: 26 U/L (ref 14–54)
AST: 24 U/L (ref 15–41)
Albumin: 4.2 g/dL (ref 3.5–5.0)
Alkaline Phosphatase: 90 U/L (ref 38–126)
Anion gap: 11 (ref 5–15)
BILIRUBIN TOTAL: 0.7 mg/dL (ref 0.3–1.2)
BUN: 6 mg/dL (ref 6–20)
CALCIUM: 8.9 mg/dL (ref 8.9–10.3)
CO2: 24 mmol/L (ref 22–32)
Chloride: 103 mmol/L (ref 101–111)
Creatinine, Ser: 0.72 mg/dL (ref 0.44–1.00)
Glucose, Bld: 96 mg/dL (ref 65–99)
Potassium: 3.4 mmol/L — ABNORMAL LOW (ref 3.5–5.1)
SODIUM: 138 mmol/L (ref 135–145)
TOTAL PROTEIN: 8 g/dL (ref 6.5–8.1)

## 2017-03-07 LAB — URINALYSIS, ROUTINE W REFLEX MICROSCOPIC
BACTERIA UA: NONE SEEN
Bilirubin Urine: NEGATIVE
Glucose, UA: NEGATIVE mg/dL
Ketones, ur: NEGATIVE mg/dL
LEUKOCYTES UA: NEGATIVE
NITRITE: NEGATIVE
Protein, ur: NEGATIVE mg/dL
RBC / HPF: NONE SEEN RBC/hpf (ref 0–5)
Specific Gravity, Urine: 1.004 — ABNORMAL LOW (ref 1.005–1.030)
WBC UA: NONE SEEN WBC/hpf (ref 0–5)
pH: 6 (ref 5.0–8.0)

## 2017-03-07 LAB — CBC
HCT: 39.1 % (ref 36.0–46.0)
HEMOGLOBIN: 13.1 g/dL (ref 12.0–15.0)
MCH: 30.3 pg (ref 26.0–34.0)
MCHC: 33.5 g/dL (ref 30.0–36.0)
MCV: 90.5 fL (ref 78.0–100.0)
Platelets: 290 10*3/uL (ref 150–400)
RBC: 4.32 MIL/uL (ref 3.87–5.11)
RDW: 13 % (ref 11.5–15.5)
WBC: 9 10*3/uL (ref 4.0–10.5)

## 2017-03-07 LAB — LIPASE, BLOOD: Lipase: 35 U/L (ref 11–51)

## 2017-03-07 MED ORDER — SODIUM CHLORIDE 0.9 % IV BOLUS (SEPSIS)
1000.0000 mL | Freq: Once | INTRAVENOUS | Status: AC
  Administered 2017-03-07: 1000 mL via INTRAVENOUS

## 2017-03-07 MED ORDER — ACETAMINOPHEN 325 MG PO TABS
650.0000 mg | ORAL_TABLET | Freq: Once | ORAL | Status: AC
  Administered 2017-03-07: 650 mg via ORAL
  Filled 2017-03-07: qty 2

## 2017-03-07 MED ORDER — ONDANSETRON HCL 4 MG/2ML IJ SOLN
4.0000 mg | Freq: Once | INTRAMUSCULAR | Status: AC
  Administered 2017-03-07: 4 mg via INTRAVENOUS
  Filled 2017-03-07: qty 2

## 2017-03-07 MED ORDER — KETOROLAC TROMETHAMINE 30 MG/ML IJ SOLN
30.0000 mg | Freq: Once | INTRAMUSCULAR | Status: AC
  Administered 2017-03-07: 30 mg via INTRAVENOUS
  Filled 2017-03-07: qty 1

## 2017-03-07 MED ORDER — ONDANSETRON 8 MG PO TBDP: 8.0000 mg | ORAL_TABLET | Freq: Three times a day (TID) | ORAL | 0 refills | Status: AC | PRN

## 2017-03-07 MED ORDER — ALBUTEROL SULFATE HFA 108 (90 BASE) MCG/ACT IN AERS
2.0000 | INHALATION_SPRAY | RESPIRATORY_TRACT | Status: DC
  Administered 2017-03-07: 2 via RESPIRATORY_TRACT
  Filled 2017-03-07: qty 6.7

## 2017-03-07 NOTE — ED Notes (Signed)
Pt ambulatory and independent at discharge.  Verbalized understanding of discharge instructions 

## 2017-03-07 NOTE — ED Triage Notes (Signed)
Pt complaint of nasal congestion, productive cough, headache, nausea, and generalized abdominal pain onset a week ago.

## 2017-03-07 NOTE — ED Provider Notes (Signed)
[facility] DEPT Provider Note   CSN: [id number] Arrival date & time: 03/07/17  2831     History   Chief Complaint Chief Complaint  Patient presents with  . Multiple Complaints    HPI Madeline Singh is a 41 y.o. female.  HPI Patient is a 41 year old female who presents the emergency department complaining of nasal congestion and productive cough as well as headache and nausea and generalized cramps.  Denies diarrhea.  No vomiting.  No fever.  Symptoms are moderate in severity.  Denies shortness of breath  Past Medical History:  Diagnosis Date  . Bursitis   . Chronic headaches   . Sciatica     Patient Active Problem List   Diagnosis Date Noted  . Family history of malignant neoplasm of gastrointestinal tract 06/09/2011  . Constipation 06/09/2011    Past Surgical History:  Procedure Laterality Date  . BACK SURGERY    . BACK SURGERY    . CHOLECYSTECTOMY    . laproscopic & debriding of right shoulder      OB History    No data available       Home Medications    Prior to Admission medications   Medication Sig Start Date End Date Taking? Authorizing Provider  acetaminophen (TYLENOL) 500 MG tablet Take 1,000 mg by mouth every 6 (six) hours as needed for mild pain, moderate pain, fever or headache.   Yes [provider]  calcium carbonate (TUMS - DOSED IN MG ELEMENTAL CALCIUM) 500 MG chewable tablet Chew 2 tablets by mouth 2 (two) times daily.   Yes [provider]  pseudoephedrine-guaifenesin (MUCINEX D) 60-600 MG 12 hr tablet Take 1 tablet by mouth every 12 (twelve) hours as needed for congestion.   Yes [provider]  ondansetron (ZOFRAN ODT) 8 MG disintegrating tablet Take 1 tablet (8 mg total) by mouth every 8 (eight) hours as needed for nausea or vomiting. 03/07/17   Jola Schmidt, MD    Family History Family History  Problem Relation Age of Onset  . Colon cancer Mother 24       Stage 4  . High  blood pressure Father   . Colon polyps Sister        Pre-Cancer Polyp   . Colon cancer Paternal Uncle     Social History Social History  Substance Use Topics  . Smoking status: Current Every Day Smoker    Packs/day: 0.50    Types: Cigarettes  . Smokeless tobacco: Never Used     Comment: Counseling sheet given in exam room to quit smoking   . Alcohol use No     Allergies   Patient has no known allergies.   Review of Systems Review of Systems  All other systems reviewed and are negative.    Physical Exam Updated Vital Signs BP 116/64 (BP Location: Left Arm)   Pulse 69   Temp (!) 97.5 F (36.4 C) (Axillary)   Resp 18   Ht 5\' 7"  (1.702 m)   Wt 84.4 kg (186 lb)   SpO2 99%   BMI 29.13 kg/m   Physical Exam  Constitutional: She is oriented to person, place, and time. She appears well-developed and well-nourished. No distress.  HENT:  Head: Normocephalic and atraumatic.  Eyes: EOM are normal.  Neck: Normal range of motion.  Cardiovascular: Normal rate, regular rhythm and normal heart sounds.   Pulmonary/Chest: Effort normal and breath sounds normal.  Abdominal: Soft. She exhibits no distension. There is no tenderness.  Musculoskeletal: Normal range of motion.  Neurological: She is alert and oriented to person, place, and time.  Skin: Skin is warm and dry.  Psychiatric: She has a normal mood and affect. Judgment normal.  Nursing note and vitals reviewed.    ED Treatments / Results  Labs (all labs ordered are listed, but only abnormal results are displayed) Labs Reviewed  COMPREHENSIVE METABOLIC PANEL - Abnormal; Notable for the following:       Result Value   Potassium 3.4 (*)    All other components within normal limits  URINALYSIS, ROUTINE W REFLEX MICROSCOPIC - Abnormal; Notable for the following:    Color, Urine STRAW (*)    Specific Gravity, Urine 1.004 (*)    Hgb urine dipstick SMALL (*)    Squamous Epithelial / LPF 6-30 (*)    All other components  within normal limits  LIPASE, BLOOD  CBC     EKG  EKG Interpretation None       Radiology Dg Chest 2 View  Result Date: 03/07/2017 CLINICAL DATA:  Cough and congestion EXAM: CHEST  2 VIEW COMPARISON:  February 06, 2009 FINDINGS: Lungs are clear. Heart size and pulmonary vascularity are normal. No adenopathy. No bone lesions. IMPRESSION: No edema or consolidation. Electronically Signed   By: Lowella Grip III M.D.   On: 03/07/2017 09:59    Procedures Procedures (including critical care time)  Medications Ordered in ED Medications  sodium chloride 0.9 % bolus 1,000 mL (1,000 mLs Intravenous New Bag/Given 03/07/17 1104)  ketorolac (TORADOL) 30 MG/ML injection 30 mg (30 mg Intravenous Given 03/07/17 1109)  acetaminophen (TYLENOL) tablet 650 mg (650 mg Oral Given 03/07/17 1104)  ondansetron (ZOFRAN) injection 4 mg (4 mg Intravenous Given 03/07/17 1105)     Initial Impression / Assessment and Plan / ED Course  I have reviewed the triage vital signs and the nursing notes.  Pertinent labs & imaging results that were available during my care of the patient were reviewed by me and considered in my medical decision making (see chart for details).     Patient is overall well-appearing.  Discharged home in good condition.  Primary care follow-up.  Final Clinical Impressions(s) / ED Diagnoses   Final diagnoses:  Acute bronchitis, unspecified organism    New Prescriptions New Prescriptions   ONDANSETRON (ZOFRAN ODT) 8 MG DISINTEGRATING TABLET    Take 1 tablet (8 mg total) by mouth every 8 (eight) hours as needed for nausea or vomiting.     Jola Schmidt, MD 03/07/17 1356

## 2017-03-31 ENCOUNTER — Emergency Department (HOSPITAL_COMMUNITY): Admission: EM | Admit: 2017-03-31 | Discharge: 2017-03-31 | Discharge status: Against Medical Advice | Payer: Self-pay

## 2017-03-31 ENCOUNTER — Encounter (HOSPITAL_COMMUNITY): Payer: Self-pay | Admitting: Emergency Medicine

## 2017-03-31 ENCOUNTER — Other Ambulatory Visit: Payer: Self-pay

## 2017-03-31 NOTE — ED Triage Notes (Deleted)
Patient is complaining of lower abdominal pain. Patient states it started Tuesday. Patient states she is having irritation of the vagina. Patient thinks partner gave her something.

## 2018-04-16 DIAGNOSIS — F17210 Nicotine dependence, cigarettes, uncomplicated: Secondary | ICD-10-CM | POA: Insufficient documentation

## 2018-04-16 DIAGNOSIS — B86 Scabies: Secondary | ICD-10-CM | POA: Insufficient documentation

## 2018-04-16 DIAGNOSIS — Z79899 Other long term (current) drug therapy: Secondary | ICD-10-CM | POA: Insufficient documentation

## 2018-04-17 ENCOUNTER — Encounter (HOSPITAL_COMMUNITY): Payer: Self-pay | Admitting: Emergency Medicine

## 2018-04-17 ENCOUNTER — Other Ambulatory Visit: Payer: Self-pay

## 2018-04-17 ENCOUNTER — Emergency Department (HOSPITAL_COMMUNITY)
Admission: EM | Admit: 2018-04-17 | Discharge: 2018-04-17 | Disposition: A | Discharge status: Home / Self Care | Payer: Self-pay | Attending: Emergency Medicine | Admitting: Emergency Medicine

## 2018-04-17 DIAGNOSIS — B86 Scabies: Secondary | ICD-10-CM

## 2018-04-17 MED ORDER — PERMETHRIN 5 % EX CREA: TOPICAL_CREAM | CUTANEOUS | 1 refills | Status: AC

## 2018-04-17 MED ORDER — HYDROXYZINE HCL 25 MG PO TABS: 25.0000 mg | ORAL_TABLET | Freq: Four times a day (QID) | ORAL | 0 refills | Status: AC | PRN

## 2018-04-17 NOTE — Discharge Instructions (Signed)
You are being prescribed a medication which may make you sleepy. For 24 hours after one dose please do not drive, operate heavy machinery, care for a small child with out another adult present, or perform any activities that may cause harm to you or someone else if you were to fall asleep or be impaired.   

## 2018-04-17 NOTE — ED Notes (Signed)
Bed: WTR6 Expected date:  Expected time:  Means of arrival:  Comments: 

## 2018-04-17 NOTE — ED Triage Notes (Signed)
Pt arriving POV with concerns of insect bites. Pt states she has noticed the bites on her arms, legs, back, and stomach and that she feels as if the insect are burrowing into her skin. Pt reports she has had her home inspected and treated by pest control.

## 2018-04-17 NOTE — ED Provider Notes (Signed)
[facility] [facility] DEPT Provider Note   CSN: [id number] Arrival date & time: 04/16/18  2319     History   Chief Complaint Chief Complaint  Patient presents with  . Insect Bite    HPI Athena Baltz is a 42 y.o. female who presents today for evaluation of insect bites.  She reports that for the past few weeks she has had wounds on her body, including her hands and between her fingers.  They had an exterminator come out to the house who told them that they had mites.  She is concerned that she may have scabies.  She reports generalized itching.  She is concerned that one spot may be infected where she has been picking at the wound.  No fevers chills other complaints or concerns today.  HPI  Past Medical History:  Diagnosis Date  . Bursitis   . Chronic headaches   . Sciatica     Patient Active Problem List   Diagnosis Date Noted  . Family history of malignant neoplasm of gastrointestinal tract 06/09/2011  . Constipation 06/09/2011    Past Surgical History:  Procedure Laterality Date  . BACK SURGERY    . BACK SURGERY    . CHOLECYSTECTOMY    . laproscopic & debriding of right shoulder       OB History   None      Home Medications    Prior to Admission medications   Medication Sig Start Date End Date Taking? Authorizing Provider  acetaminophen (TYLENOL) 500 MG tablet Take 1,000 mg by mouth every 6 (six) hours as needed for mild pain, moderate pain, fever or headache.    [provider]  calcium carbonate (TUMS - DOSED IN MG ELEMENTAL CALCIUM) 500 MG chewable tablet Chew 2 tablets by mouth 2 (two) times daily.    [provider]  hydrOXYzine (ATARAX/VISTARIL) 25 MG tablet Take 1 tablet (25 mg total) by mouth every 6 (six) hours as needed for itching. 04/17/18   Lorin Glass, PA-C  ondansetron (ZOFRAN ODT) 8 MG disintegrating tablet Take 1 tablet (8 mg total) by mouth every 8 (eight) hours as needed for nausea or vomiting.  03/07/17   Jola Schmidt, MD  permethrin (ELIMITE) 5 % cream Apply to entire body, wash off after 8 to 14 hours.  Please repeat in 1 week. 04/17/18   Lorin Glass, PA-C  pseudoephedrine-guaifenesin [facility] D) 60-600 MG 12 hr tablet Take 1 tablet by mouth every 12 (twelve) hours as needed for congestion.    [provider]    Family History Family History  Problem Relation Age of Onset  . Colon cancer Mother 79       Stage 4  . High blood pressure Father   . Colon polyps Sister        Pre-Cancer Polyp   . Colon cancer Paternal Uncle     Social History Social History   Tobacco Use  . Smoking status: Current Every Day Smoker    Packs/day: 0.50    Types: Cigarettes  . Smokeless tobacco: Never Used  . Tobacco comment: Counseling sheet given in exam room to quit smoking   Substance Use Topics  . Alcohol use: No    Alcohol/week: 0.0 standard drinks  . Drug use: No     Allergies   Patient has no known allergies.   Review of Systems Review of Systems  Constitutional: Negative for chills and fever.  Skin: Positive for rash.  All other systems reviewed  and are negative.    Physical Exam Updated Vital Signs BP (!) 117/96 (BP Location: Left Arm)   Pulse 86   Temp 98.1 F (36.7 C) (Oral)   Resp 16   Ht 5\' 9"  (1.753 m)   Wt 84.4 kg   SpO2 100%   BMI 27.47 kg/m   Physical Exam  Constitutional: She appears well-developed. No distress.  HENT:  Head: Normocephalic.  Cardiovascular: Normal rate.  Neurological: She is alert.  Skin: Skin is warm and dry. She is not diaphoretic.  Diffuse areas of erythema with burrows including in between fingers on bilateral hands, arms, and torso.  There is no obvious secondary infection of the viewed lesions.  No purulent drainage, abnormal induration or fluctuance.  Psychiatric: She has a normal mood and affect.  Nursing note and vitals reviewed.    ED Treatments / Results  Labs (all labs ordered are listed, but  only abnormal results are displayed) Labs Reviewed - No data to display  EKG None  Radiology No results found.  Procedures Procedures (including critical care time)  Medications Ordered in ED Medications - No data to display   Initial Impression / Assessment and Plan / ED Course  I have reviewed the triage vital signs and the nursing notes.  Pertinent labs & imaging results that were available during my care of the patient were reviewed by me and considered in my medical decision making (see chart for details).      Discussed diagnosis and treatment of scabies with patient.  She has been advised to follow-up with her primary care doctor in 2 weeks after treatment.  She has been advised to clean her entire household and follow-up with exterminator.  Discussed appropriate use of permethrin cream and indications to repeat treatment.  She verbalized understanding.  We will also give prescription for hydroxyzine for itching as needed.  Return precautions were discussed with patient who states their understanding.  At the time of discharge patient denied any unaddressed complaints or concerns.  Patient is agreeable for discharge home.   Final Clinical Impressions(s) / ED Diagnoses   Final diagnoses:  Scabies    ED Discharge Orders         Ordered    permethrin (ELIMITE) 5 % cream     04/17/18 0413    hydrOXYzine (ATARAX/VISTARIL) 25 MG tablet  Every 6 hours PRN     04/17/18 0413           Lorin Glass, PA-C 71/16/57 [id number]    Delora Fuel, MD 33/38/32 0730

## 2023-01-24 ENCOUNTER — Ambulatory Visit (HOSPITAL_COMMUNITY): Admission: EM | Admit: 2023-01-24 | Discharge: 2023-01-24 | Discharge status: Against Medical Advice | Payer: 59

## 2023-01-24 NOTE — Progress Notes (Signed)
   01/24/23 1135  [facility] Triage Screening (Walk-ins at [facility] only)  What Is the Reason for Your Visit/Call Today? Pt presents to [facility] voluntarily accompanied by her friend. Pt is diagnosed with PTSD, ADHD, anxiety and depression. Pt reports her mood swings are all over the place and she appears to have lots of emotions all over the place since she lost her son 4 years ago. Pt missed her appointment at [facility] on Thursday and she claims she is "tired of waiting for an appointment". Pt is currently off her medication for about a year and found it to not be too helpful. Pt denies having a therapist at this time and is in need of one to help work through her emotions. Pt is requesting therapy services at this time and a more current diagnosis. 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 Pt has no hx of alcohol. Pt denies any other use of substances, SI, HI and AVH.  How Long Has This Been Causing You Problems? > than 6 months  Have You Recently Had Any Thoughts About Hurting Yourself? No  Are You Planning to Commit Suicide/Harm Yourself At This time? No  Have you Recently Had Thoughts About Hurting Someone Madeline Singh? No  Are You Planning To Harm Someone At This Time? No  Are you currently experiencing any auditory, visual or other hallucinations? No  Have You Used Any Alcohol or Drugs in the Past 24 Hours? No  Do you have any current medical co-morbidities that require immediate attention? No  What Do You Feel Would Help You the Most Today? Social Support;Medication(s)  If access to [facility] was not available, would you have sought care in the Emergency Department? No  Determination of Need Routine (7 days)  Options For Referral Intensive Outpatient Therapy;Medication Management
